# Patient Record
Sex: Female | Born: 1965 | Race: Black or African American | Hispanic: No | Marital: Single | Smoking: Current every day smoker
Health system: Southern US, Community
[De-identification: ages and names within clinical notes are randomized; demographics above are authoritative.]

## PROBLEM LIST (undated history)

## (undated) DIAGNOSIS — E785 Hyperlipidemia, unspecified: Secondary | ICD-10-CM

## (undated) DIAGNOSIS — I10 Essential (primary) hypertension: Secondary | ICD-10-CM

## (undated) HISTORY — DX: Essential (primary) hypertension: I10

## (undated) HISTORY — PX: BREAST BIOPSY: SHX20

## (undated) HISTORY — DX: Hyperlipidemia, unspecified: E78.5

---

## 2013-06-29 HISTORY — PX: APPENDECTOMY: SHX54

## 2014-04-28 ENCOUNTER — Ambulatory Visit: Payer: Self-pay | Admitting: Urology

## 2014-06-26 ENCOUNTER — Emergency Department: Payer: Self-pay | Admitting: Internal Medicine

## 2014-09-24 LAB — HEPATIC FUNCTION PANEL
ALK PHOS: 94 U/L (ref 25–125)
ALT: 42 U/L — AB (ref 7–35)
AST: 37 U/L — AB (ref 13–35)
BILIRUBIN, TOTAL: 0.2 mg/dL

## 2014-09-24 LAB — TSH: TSH: 0.74 u[IU]/mL (ref <=5.90)

## 2014-09-24 LAB — BASIC METABOLIC PANEL
BUN: 5 mg/dL (ref 4–21)
CREATININE: 0.6 mg/dL (ref <=1.1)
GLUCOSE: 93 mg/dL
SODIUM: 141 mmol/L (ref 137–147)

## 2014-09-24 LAB — LIPID PANEL
Cholesterol: 227 mg/dL — AB (ref 0–200)
HDL: 29 mg/dL — AB (ref 35–70)
Triglycerides: 617 mg/dL — AB (ref 40–160)

## 2014-09-24 LAB — CBC AND DIFFERENTIAL
HCT: 40 % (ref 36–46)
Hemoglobin: 13.3 g/dL (ref 12.0–16.0)
PLATELETS: 368 10*3/uL (ref 150–399)
WBC: 5.9 10*3/mL

## 2014-10-02 ENCOUNTER — Ambulatory Visit: Payer: Self-pay | Admitting: Specialist

## 2015-03-31 ENCOUNTER — Other Ambulatory Visit: Payer: Self-pay

## 2015-04-15 ENCOUNTER — Ambulatory Visit: Payer: Self-pay | Admitting: Internal Medicine

## 2015-04-18 DIAGNOSIS — M5010 Cervical disc disorder with radiculopathy, unspecified cervical region: Secondary | ICD-10-CM

## 2015-04-18 DIAGNOSIS — E785 Hyperlipidemia, unspecified: Secondary | ICD-10-CM

## 2015-04-18 DIAGNOSIS — I10 Essential (primary) hypertension: Secondary | ICD-10-CM

## 2015-04-29 ENCOUNTER — Ambulatory Visit: Payer: Self-pay | Admitting: Internal Medicine

## 2015-07-30 IMAGING — CR CERVICAL SPINE - COMPLETE 4+ VIEW
1 series · 5 of 5 positions shown · non-contrast
Comparison: None.

CLINICAL DATA: Left-sided neck pain.

EXAM:
CERVICAL SPINE  4+ VIEWS

[Series 1: dxr cervical spine complete · 0.14mm/px · 5 of 5 slices shown]
[im 1/5]
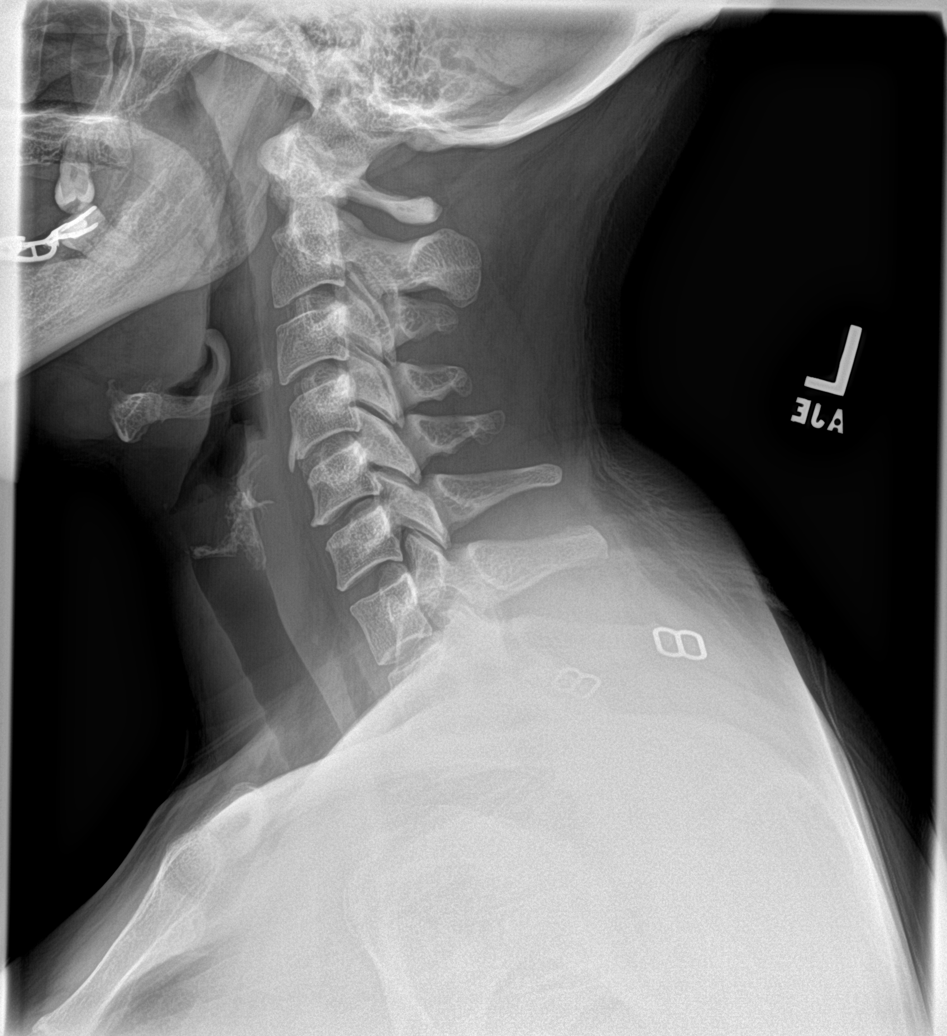
[im 2/5]
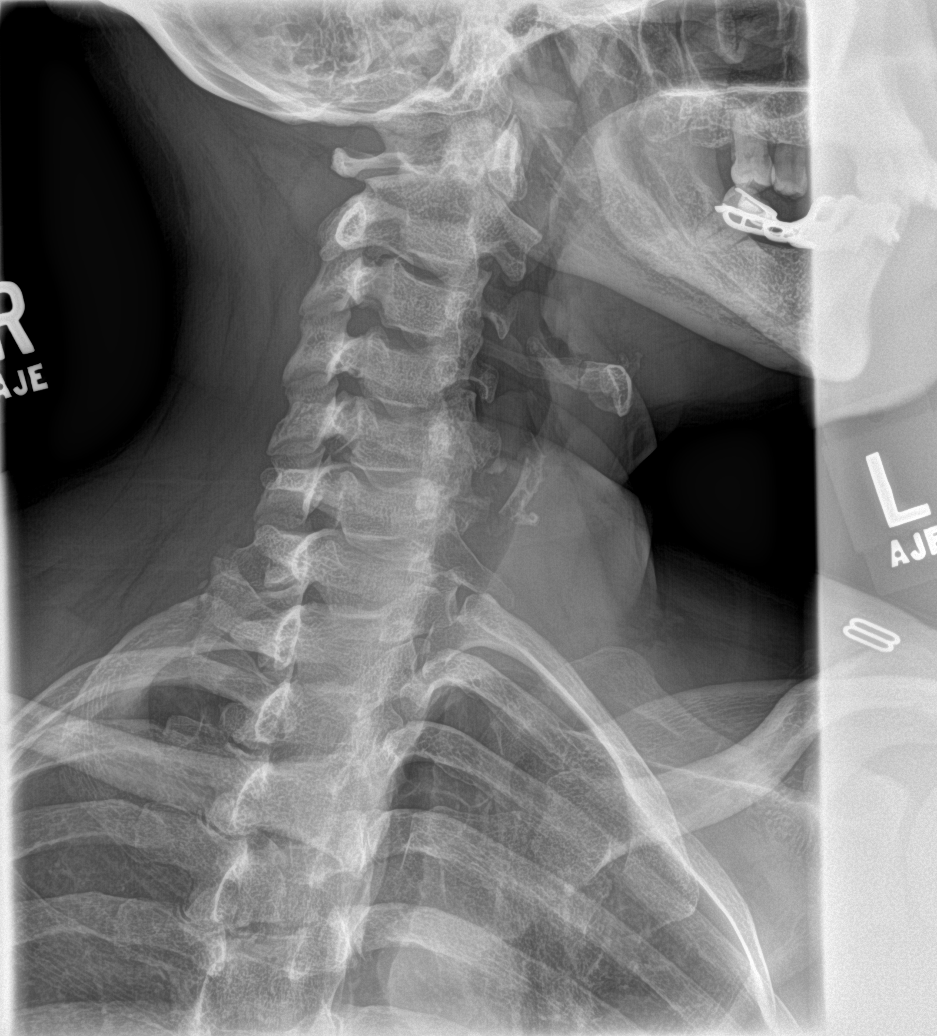
[im 3/5]
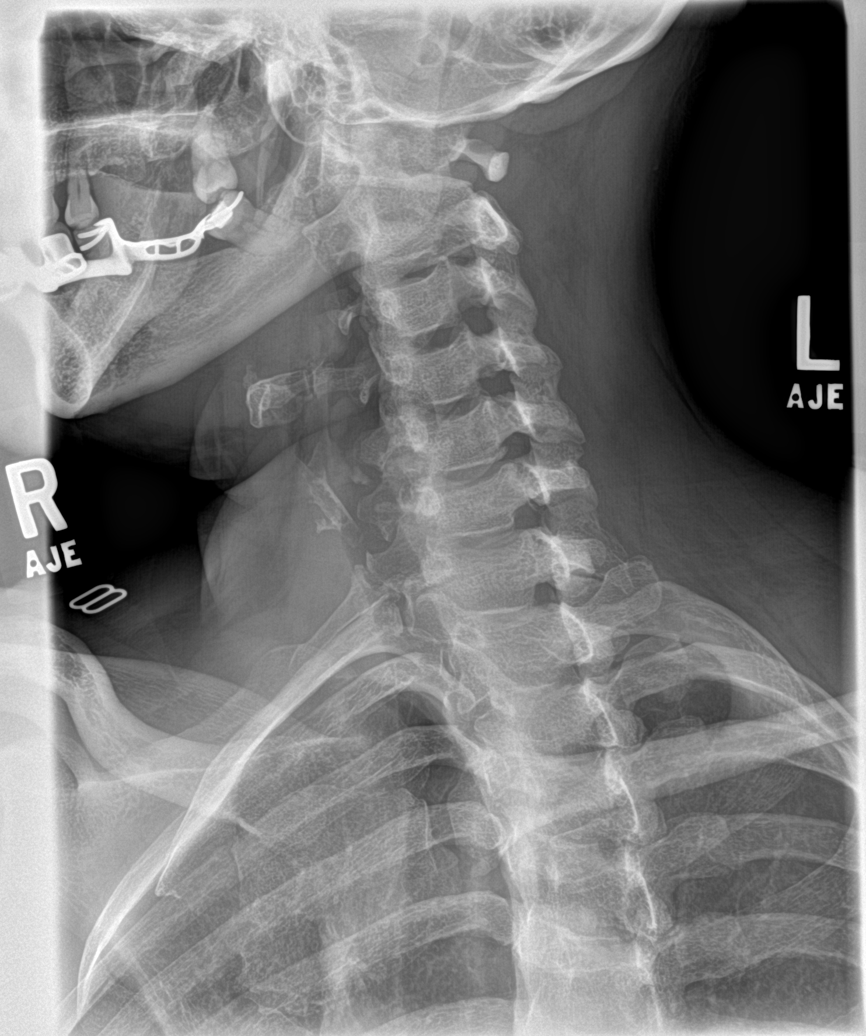
[im 4/5]
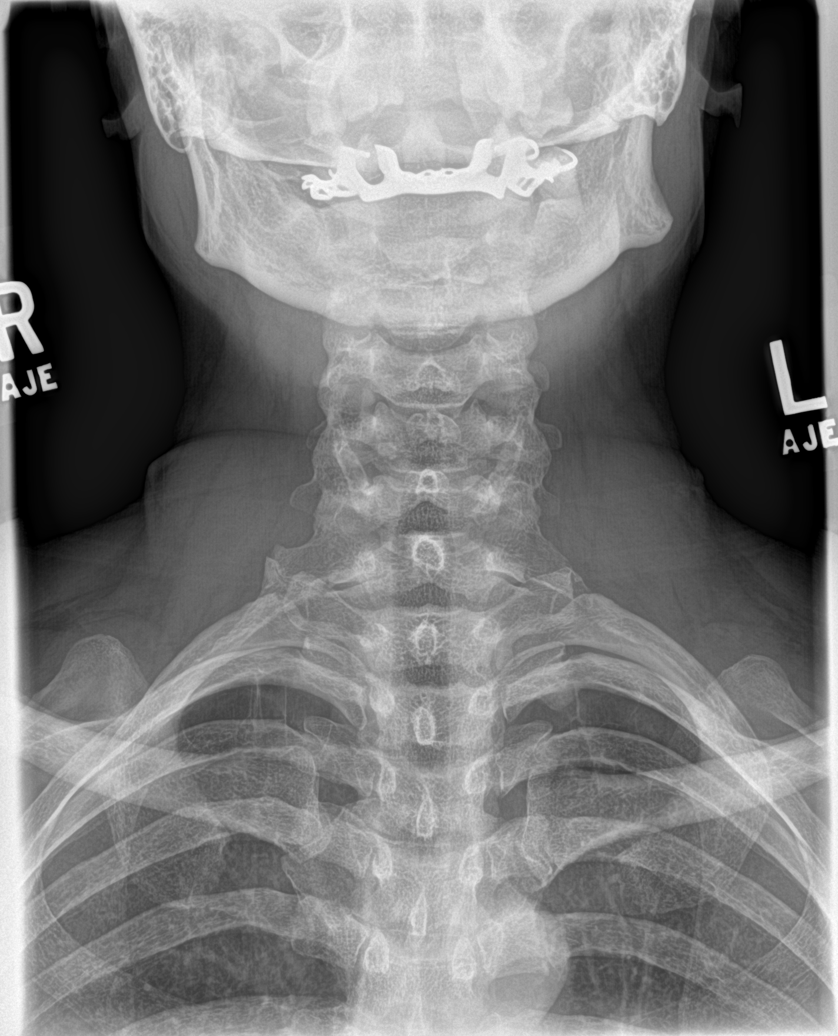
[im 5/5]
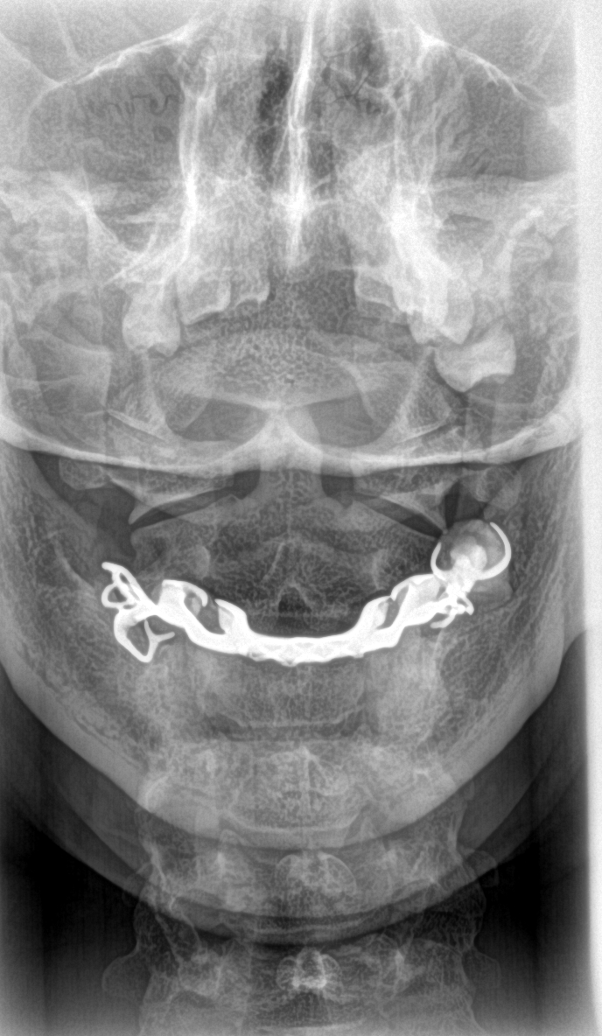

[5 of 5 positions shown; findings below may reference images not displayed]

FINDINGS: There is no evidence of cervical spine fracture or prevertebral soft
tissue swelling. Alignment is normal. Mild anterior vertebral
osteophyte formation seen at C4-5 and C5-6, with mild disc space
narrowing at C5-6. No other significant bone abnormality identified.
IMPRESSION: No acute findings.

Mild degenerative disc disease at C4-5 and C5-6.

## 2015-09-23 ENCOUNTER — Other Ambulatory Visit: Payer: Self-pay

## 2015-10-14 ENCOUNTER — Encounter: Payer: Self-pay | Admitting: Emergency Medicine

## 2015-10-14 ENCOUNTER — Emergency Department
Admission: EM | Admit: 2015-10-14 | Discharge: 2015-10-14 | Disposition: A | Discharge status: Home / Self Care | Payer: Self-pay | Attending: Emergency Medicine | Admitting: Emergency Medicine

## 2015-10-14 DIAGNOSIS — I10 Essential (primary) hypertension: Secondary | ICD-10-CM | POA: Insufficient documentation

## 2015-10-14 DIAGNOSIS — Z791 Long term (current) use of non-steroidal anti-inflammatories (NSAID): Secondary | ICD-10-CM | POA: Insufficient documentation

## 2015-10-14 DIAGNOSIS — Y998 Other external cause status: Secondary | ICD-10-CM | POA: Insufficient documentation

## 2015-10-14 DIAGNOSIS — F17210 Nicotine dependence, cigarettes, uncomplicated: Secondary | ICD-10-CM | POA: Insufficient documentation

## 2015-10-14 DIAGNOSIS — R21 Rash and other nonspecific skin eruption: Secondary | ICD-10-CM

## 2015-10-14 DIAGNOSIS — T63481A Toxic effect of venom of other arthropod, accidental (unintentional), initial encounter: Secondary | ICD-10-CM | POA: Insufficient documentation

## 2015-10-14 DIAGNOSIS — W57XXXA Bitten or stung by nonvenomous insect and other nonvenomous arthropods, initial encounter: Secondary | ICD-10-CM

## 2015-10-14 DIAGNOSIS — Y9389 Activity, other specified: Secondary | ICD-10-CM | POA: Insufficient documentation

## 2015-10-14 DIAGNOSIS — Z79899 Other long term (current) drug therapy: Secondary | ICD-10-CM | POA: Insufficient documentation

## 2015-10-14 DIAGNOSIS — Y9289 Other specified places as the place of occurrence of the external cause: Secondary | ICD-10-CM | POA: Insufficient documentation

## 2015-10-14 NOTE — ED Provider Notes (Signed)
[facility] Emergency Department Provider Note  ____________________________________________  Time seen: Approximately 5:43 PM  I have reviewed the triage vital signs and the nursing notes.   HISTORY  Chief Complaint Rash    HPI Erica Moreno is a 50 y.o. female with no relevant past medical history who presents with a gradual onset of rash over the last 3 days.  She states that the rash are distinct and individual itching and burning bumps primarily on her arms, her lower neck or upper chest, and a couple on her legs.  She states that the itching is much worse at night and in the morning it seems to be worse.  Benadryl helps a little.  It has been getting gradually worse over the last 3 days.  Some of the lesions are larger than others.  Several have a small central area but not all of them.  She denies fever/chills, chest pain, shortness breath, nausea, vomiting, abdominal pain, diarrhea, dysuria.  She has not started on any new medications recently.  She has not changed beauty products or soaps or detergents recently.  Her husband, with whom she shares her bed, has not had any lesions.She has not had any wheezing or difficulty breathing.  He has no significant seasonal or environmental allergies of which she is aware.   Past Medical History  Diagnosis Date  . Hypertension     Patient Active Problem List   Diagnosis Date Noted  . Essential hypertension 04/18/2015  . 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 Elevated lipids 04/18/2015    Past Surgical History  Procedure Laterality Date  . Appendectomy  06/2013  . Cesarean section  1988    Current Outpatient Rx  Name  Route  Sig  Dispense  Refill  . atenolol (TENORMIN) 50 MG tablet   Oral   Take 50 mg by mouth 2 (two) times daily.         . cyclobenzaprine (FLEXERIL) 10 MG tablet   Oral   Take 10 mg by mouth at bedtime.         Marland Kitchen gemfibrozil (LOPID) 600 MG  tablet   Oral   Take 600 mg by mouth 2 (two) times daily before a meal.         . hydrochlorothiazide (HYDRODIURIL) 25 MG tablet   Oral   Take 25 mg by mouth daily.         . naproxen (NAPROSYN) 250 MG tablet   Oral   Take 250 mg by mouth 2 (two) times daily with a meal.         . rosuvastatin (CRESTOR) 10 MG tablet   Oral   Take 10 mg by mouth daily.           Allergies Aspirin  Family History  Problem Relation Age of Onset  . Heart failure Mother   . Aneurysm Father   . Renal Disease Father     Social History Social History  Substance Use Topics  . Smoking status: Current Every Day Smoker -- 0.25 packs/day    Types: Cigarettes    Last Attempt to Quit: 01/16/2015  . Smokeless tobacco: None  . Alcohol Use: No    Review of Systems Constitutional: No fever/chills Eyes: No visual changes. ENT: No sore throat. Cardiovascular: Denies chest pain. Respiratory: Denies shortness of breath. Gastrointestinal: No abdominal pain.  No nausea, no vomiting.  No diarrhea.  No constipation. Genitourinary: Negative for dysuria. Musculoskeletal: Negative for back pain.  Skin: Itching and burning red raised rash on her arms, upper chest and lower neck, and a few on her legs Neurological: Negative for headaches, focal weakness or numbness.  10-point ROS otherwise negative.  ____________________________________________   PHYSICAL EXAM:  VITAL SIGNS: ED Triage Vitals  Enc Vitals Group     BP 10/14/15 1647 151/74 mmHg     Pulse Rate 10/14/15 1647 96     Resp 10/14/15 1647 20     Temp 10/14/15 1647 98.3 F (36.8 C)     Temp Source 10/14/15 1647 Oral     SpO2 10/14/15 1647 100 %     Weight 10/14/15 1647 158 lb (71.668 kg)     Height 10/14/15 1647  (1.651 m)     Head Cir --      Peak Flow --      Pain Score 10/14/15 1647 8     Pain Loc --      Pain Edu? --      Excl. in GC? --     Constitutional: Alert and oriented. Well appearing and in no acute  distress. Eyes: Conjunctivae are normal. PERRL. EOMI. Head: Atraumatic. Nose: No congestion/rhinnorhea. Mouth/Throat: Mucous membranes are moist.  Oropharynx non-erythematous.  No intraoral lesions, no mucosal involvement. Neck: No stridor.   Hematological/Lymphatic/Immunilogical: No cervical lymphadenopathy. Cardiovascular: Normal rate, regular rhythm. Grossly normal heart sounds.  Good peripheral circulation. Respiratory: Normal respiratory effort.  No retractions. Lungs CTAB. Gastrointestinal: Soft and nontender. No distention. No abdominal bruits. No CVA tenderness. Musculoskeletal: No lower extremity tenderness nor edema.  No joint effusions. Neurologic:  Normal speech and language. No gross focal neurologic deficits are appreciated.  Skin:  Skin is warm, dry and intact.  She has multiple maculopapular lesions of varying sizes from several millimeters to several centimeters in diameter.  These are on exposed surfaces such as her arms and the lower part of her neck.  There is a linear pattern to many of them although not close enough together to be clearly consistent with bedbugs.  A few of the lesions have a very small central pustule but these are very tiny and and not consistent with "dewdrops on a rose petal".  There is no evidence of cellulitis or bacterial superinfection.  There are no lesions on her anterior torso or abdomen.  There are no lesions on her palms or soles. Psychiatric: Mood and affect are normal. Speech and behavior are normal.  ____________________________________________   LABS (all labs ordered are listed, but only abnormal results are displayed)  Labs Reviewed - No data to display ____________________________________________  EKG  None ____________________________________________  RADIOLOGY   No results found.  ____________________________________________   PROCEDURES  Procedure(s) performed: None  Critical Care performed:  No ____________________________________________   INITIAL IMPRESSION / ASSESSMENT AND PLAN / ED COURSE  Pertinent labs & imaging results that were available during my care of the patient were reviewed by me and considered in my medical decision making (see chart for details).  The patient's lesions appear most consistent with insect bites although the specific etiology is unknown.  It is no evidence of Stevens-Johnson syndrome, bullous pemphigoid, erythema multiforme, or other emergent medical condition at this time.  I counseled the patient that she can use some cream or ointment on the lesions and take oral Benadryl, but that she also needs to follow up with dermatology and also have a pest control services investigate her house to determine if she has bedbugs or another infestation that is causing  the lesions.  She understands and agrees with the plan.  ____________________________________________  FINAL CLINICAL IMPRESSION(S) / ED DIAGNOSES  Final diagnoses:  Insect bites and stings, accidental or unintentional, initial encounter  Maculopapular rash      NEW MEDICATIONS STARTED DURING THIS VISIT:  New Prescriptions   No medications on file     Hinda Kehr, MD 10/14/15 1839

## 2015-10-14 NOTE — ED Notes (Signed)
Patient presents to the ED with a rash that she first noticed about 3 days ago.  Patient states rash is on legs, back, arms, and neck.  Patient denies changing soaps or detergents.  Patient is in no obvious distress at this time.

## 2015-10-14 NOTE — Discharge Instructions (Signed)
As we discussed, we believe you are rash is due to insect bites.  Please continue to take over-the-counter Benadryl as needed and as recommended on the package instructions.  Additionally we encourage you to use over-the-counter hydrocortisone 1% ointment or cream on the lesions that are most bothersome to which should help with the inflammation and itching.  We encourage you to follow up with the [facility] for additional evaluation.  We also encourage you to contact a pest control center to come out to your house and do an investigation to determine if you do have an infestation that could be treated.  Return to the emergency department if he develop any new or worsening symptoms that concern you.

## 2015-10-27 ENCOUNTER — Other Ambulatory Visit: Payer: Self-pay

## 2015-10-27 DIAGNOSIS — E785 Hyperlipidemia, unspecified: Secondary | ICD-10-CM

## 2015-10-27 DIAGNOSIS — I10 Essential (primary) hypertension: Secondary | ICD-10-CM

## 2015-10-27 DIAGNOSIS — Z139 Encounter for screening, unspecified: Secondary | ICD-10-CM

## 2015-10-28 ENCOUNTER — Other Ambulatory Visit: Payer: Self-pay

## 2015-11-04 ENCOUNTER — Ambulatory Visit: Payer: Self-pay | Admitting: Internal Medicine

## 2015-11-10 ENCOUNTER — Telehealth: Payer: Self-pay

## 2015-11-10 NOTE — Telephone Encounter (Signed)
Called patient  to schedule appointment, voice mail was full. [phone]

## 2015-11-10 NOTE — Telephone Encounter (Signed)
Called patient to reschedule appointment, mail box was full. [phone]

## 2015-11-10 NOTE — Telephone Encounter (Signed)
Patient called to reschedule missed appointment. [phone]

## 2015-11-19 ENCOUNTER — Telehealth: Payer: Self-pay

## 2015-11-19 NOTE — Telephone Encounter (Signed)
Patient called to reschedule missed appointment. [phone] 

## 2015-12-03 ENCOUNTER — Ambulatory Visit: Payer: Self-pay

## 2015-12-08 ENCOUNTER — Telehealth: Payer: Self-pay

## 2015-12-08 NOTE — Telephone Encounter (Signed)
Lm to discuss appointment, pt must pay $16 no show fee and needs to complete [facility], Mrs has no showed last 4 appointments  [phone]    No answer @ [phone]

## 2015-12-08 NOTE — Telephone Encounter (Signed)
Pt lm to sched an appointment.,  [phone]

## 2015-12-24 ENCOUNTER — Ambulatory Visit: Payer: Self-pay | Admitting: Family Medicine

## 2015-12-24 VITALS — BP 176/90 | HR 77 | Ht 65.0 in | Wt 158.0 lb

## 2015-12-24 DIAGNOSIS — Z5181 Encounter for therapeutic drug level monitoring: Secondary | ICD-10-CM

## 2015-12-24 DIAGNOSIS — M5010 Cervical disc disorder with radiculopathy, unspecified cervical region: Secondary | ICD-10-CM

## 2015-12-24 DIAGNOSIS — Z1211 Encounter for screening for malignant neoplasm of colon: Secondary | ICD-10-CM | POA: Insufficient documentation

## 2015-12-24 DIAGNOSIS — E785 Hyperlipidemia, unspecified: Secondary | ICD-10-CM

## 2015-12-24 DIAGNOSIS — I10 Essential (primary) hypertension: Secondary | ICD-10-CM

## 2015-12-24 DIAGNOSIS — M25512 Pain in left shoulder: Secondary | ICD-10-CM

## 2015-12-24 MED ORDER — NAPROXEN 250 MG PO TABS: 250.0000 mg | ORAL_TABLET | Freq: Two times a day (BID) | ORAL | Status: DC

## 2015-12-24 MED ORDER — ROSUVASTATIN CALCIUM 10 MG PO TABS: 10.0000 mg | ORAL_TABLET | Freq: Every day | ORAL | Status: DC

## 2015-12-24 MED ORDER — ATENOLOL 50 MG PO TABS: 50.0000 mg | ORAL_TABLET | Freq: Two times a day (BID) | ORAL | Status: DC

## 2015-12-24 MED ORDER — CYCLOBENZAPRINE HCL 10 MG PO TABS: 10.0000 mg | ORAL_TABLET | Freq: Every day | ORAL | Status: DC

## 2015-12-24 MED ORDER — HYDROCHLOROTHIAZIDE 25 MG PO TABS: 25.0000 mg | ORAL_TABLET | Freq: Every day | ORAL | Status: DC

## 2015-12-24 NOTE — Patient Instructions (Addendum)
We'll check your labs today and contact you with those results; if you have not heard anything in one week, please call the clinic We'll refer you for a screening colonoscopy; likewise, if you have not heard anything from [place] or [facility] about the colonoscopy in 2 weeks, please call us Try to limit saturated fats in your diet (bologna, hot dogs, barbeque, cheeseburgers, hamburgers, steak, bacon, sausage, cheese, etc.) and get more fresh fruits, vegetables, and whole grains Your goal blood pressure is less than 140 mmHg on top. Try to follow the DASH guidelines (DASH stands for Dietary Approaches to Stop Hypertension) Try to limit the sodium in your diet.  Ideally, consume less than 1.5 grams (less than 1,500mg ) per day. Do not add salt when cooking or at the table.  Check the sodium amount on labels when shopping, and choose items lower in sodium when given a choice. Avoid or limit foods that already contain a lot of sodium. Eat a diet rich in fruits and vegetables and whole grains. Try to use tylenol (acetaminophen) per package directions for most aches and pains when possible; use the naproxen only when needed as that can raise your blood pressure I do encourage you to quit smoking Call [phone] to sign up for smoking cessation classes You can call 1-800-QUIT-NOW to talk with a smoking cessation coach   DASH Eating Plan DASH stands for "Dietary Approaches to Stop Hypertension." The DASH eating plan is a healthy eating plan that has been shown to reduce high blood pressure (hypertension). Additional health benefits may include reducing the risk of type 2 diabetes mellitus, heart disease, and stroke. The DASH eating plan may also help with weight loss. WHAT DO I NEED TO KNOW ABOUT THE DASH EATING PLAN? For the DASH eating plan, you will follow these general guidelines:  Choose foods with a percent daily value for sodium of less than 5% (as listed on the food label).  Use salt-free seasonings or  herbs instead of table salt or sea salt.  Check with your health care provider or pharmacist before using salt substitutes.  Eat lower-sodium products, often labeled as "lower sodium" or "no salt added."  Eat fresh foods.  Eat more vegetables, fruits, and low-fat dairy products.  Choose whole grains. Look for the word "whole" as the first word in the ingredient list.  Choose fish and skinless chicken or [place] more often than red meat. Limit fish, poultry, and meat to 6 oz (170 g) each day.  Limit sweets, desserts, sugars, and sugary drinks.  Choose heart-healthy fats.  Limit cheese to 1 oz (28 g) per day.  Eat more home-cooked food and less restaurant, buffet, and fast food.  Limit fried foods.  Cook foods using methods other than frying.  Limit canned vegetables. If you do use them, rinse them well to decrease the sodium.  When eating at a restaurant, ask that your food be prepared with less salt, or no salt if possible. WHAT FOODS CAN I EAT? Seek help from a dietitian for individual calorie needs. Grains Whole grain or whole wheat bread. Brown rice. Whole grain or whole wheat pasta. Quinoa, bulgur, and whole grain cereals. Low-sodium cereals. Corn or whole wheat flour tortillas. Whole grain cornbread. Whole grain crackers. Low-sodium crackers. Vegetables Fresh or frozen vegetables (raw, steamed, roasted, or grilled). Low-sodium or reduced-sodium tomato and vegetable juices. Low-sodium or reduced-sodium tomato sauce and paste. Low-sodium or reduced-sodium canned vegetables.  Fruits All fresh, canned (in natural juice), or frozen fruits. Meat  and Other Protein Products Ground beef (85% or leaner), grass-fed beef, or beef trimmed of fat. Skinless chicken or [place]. Ground chicken or [place]. Pork trimmed of fat. All fish and seafood. Eggs. Dried beans, peas, or lentils. Unsalted nuts and seeds. Unsalted canned beans. Dairy Low-fat dairy products, such as skim or 1% milk, 2% or  reduced-fat cheeses, low-fat ricotta or cottage cheese, or plain low-fat yogurt. Low-sodium or reduced-sodium cheeses. Fats and Oils Tub margarines without trans fats. Light or reduced-fat mayonnaise and salad dressings (reduced sodium). Avocado. Safflower, olive, or canola oils. Natural peanut or almond butter. Other Unsalted popcorn and pretzels. The items listed above may not be a complete list of recommended foods or beverages. Contact your dietitian for more options. WHAT FOODS ARE NOT RECOMMENDED? Grains White bread. White pasta. White rice. Refined cornbread. Bagels and croissants. Crackers that contain trans fat. Vegetables Creamed or fried vegetables. Vegetables in a cheese sauce. Regular canned vegetables. Regular canned tomato sauce and paste. Regular tomato and vegetable juices. Fruits Dried fruits. Canned fruit in light or heavy syrup. Fruit juice. Meat and Other Protein Products Fatty cuts of meat. Ribs, chicken wings, bacon, sausage, bologna, salami, chitterlings, fatback, hot dogs, bratwurst, and packaged luncheon meats. Salted nuts and seeds. Canned beans with salt. Dairy Whole or 2% milk, cream, half-and-half, and cream cheese. Whole-fat or sweetened yogurt. Full-fat cheeses or blue cheese. Nondairy creamers and whipped toppings. Processed cheese, cheese spreads, or cheese curds. Condiments Onion and garlic salt, seasoned salt, table salt, and sea salt. Canned and packaged gravies. Worcestershire sauce. Tartar sauce. Barbecue sauce. Teriyaki sauce. Soy sauce, including reduced sodium. Steak sauce. Fish sauce. Oyster sauce. Cocktail sauce. Horseradish. Ketchup and mustard. Meat flavorings and tenderizers. Bouillon cubes. Hot sauce. Tabasco sauce. Marinades. Taco seasonings. Relishes. Fats and Oils Butter, stick margarine, lard, shortening, ghee, and bacon fat. Coconut, palm kernel, or palm oils. Regular salad dressings. Other Pickles and olives. Salted popcorn and  pretzels. The items listed above may not be a complete list of foods and beverages to avoid. Contact your dietitian for more information. WHERE CAN I FIND MORE INFORMATION? National Heart, Lung, and Blood Institute: [employer]   This information is not intended to replace advice given to you by your health care provider. Make sure you discuss any questions you have with your health care provider.   Document Released: 08/04/2011 Document Revised: 09/05/2014 Document Reviewed: 06/19/2013 Elsevier Interactive Patient Education 2016 [employer] Cholesterol Cholesterol is a white, waxy, fat-like substance needed by your body in small amounts. The liver makes all the cholesterol you need. Cholesterol is carried from the liver by the blood through the blood vessels. Deposits of cholesterol (plaque) may build up on blood vessel walls. These make the arteries narrower and stiffer. Cholesterol plaques increase the risk for heart attack and stroke.  You cannot feel your cholesterol level even if it is very high. The only way to know it is high is with a blood test. Once you know your cholesterol levels, you should keep a record of the test results. Work with your health care provider to keep your levels in the desired range.  WHAT DO THE RESULTS MEAN?  Total cholesterol is a rough measure of all the cholesterol in your blood.   LDL is the so-called bad cholesterol. This is the type that deposits cholesterol in the walls of the arteries. You want this level to be low.   HDL is the good cholesterol because it cleans the arteries and carries the  LDL away. You want this level to be high.  Triglycerides are fat that the body can either burn for energy or store. High levels are closely linked to heart disease.  WHAT ARE THE DESIRED LEVELS OF CHOLESTEROL?  Total cholesterol below 200.   LDL below 100 for people at risk, below 70 for those at very high risk.    HDL above 50 is good, above 60 is best.   Triglycerides below 150.  HOW CAN I LOWER MY CHOLESTEROL?  Diet. Follow your diet programs as directed by your health care provider.   Choose fish or white meat chicken and [place], roasted or baked. Limit fatty cuts of red meat, fried foods, and processed meats, such as sausage and lunch meats.   Eat lots of fresh fruits and vegetables.  Choose whole grains, beans, pasta, potatoes, and cereals.   Use only small amounts of olive, corn, or canola oils.   Avoid butter, mayonnaise, shortening, or palm kernel oils.  Avoid foods with trans fats.   Drink skim or nonfat milk and eat low-fat or nonfat yogurt and cheeses. Avoid whole milk, cream, ice cream, egg yolks, and full-fat cheeses.   Healthy desserts include angel food cake, ginger snaps, animal crackers, hard candy, popsicles, and low-fat or nonfat frozen yogurt. Avoid pastries, cakes, pies, and cookies.   Exercise. Follow your exercise programs as directed by your health care provider.   A regular program helps decrease LDL and raise HDL.   A regular program helps with weight control.   Do things that increase your activity level like gardening, walking, or taking the stairs. Ask your health care provider about how you can be more active in your daily life.   Medicine. Take medicine only as directed by your health care provider.   Medicine may be prescribed by your health care provider to help lower cholesterol and decrease the risk for heart disease.   If you have several risk factors, you may need medicine even if your levels are normal.   This information is not intended to replace advice given to you by your health care provider. Make sure you discuss any questions you have with your health care provider.   Document Released: 05/10/2001 Document Revised: 09/05/2014 Document Reviewed: 05/29/2013 Elsevier Interactive Patient Education 2016 [employer]. You Can  Quit Smoking If you are ready to quit smoking or are thinking about it, congratulations! You have chosen to help yourself be healthier and live longer! There are lots of different ways to quit smoking. Nicotine gum, nicotine patches, a nicotine inhaler, or nicotine nasal spray can help with physical craving. Hypnosis, support groups, and medicines help break the habit of smoking. TIPS TO GET OFF AND STAY OFF CIGARETTES  Learn to predict your moods. Do not let a bad situation be your excuse to have a cigarette. Some situations in your life might tempt you to have a cigarette.  Ask friends and co-workers not to smoke around you.  Make your home smoke-free.  Never have "just one" cigarette. It leads to wanting another and another. Remind yourself of your decision to quit.  On a card, make a list of your reasons for not smoking. Read it at least the same number of times a day as you have a cigarette. Tell yourself everyday, "I do not want to smoke. I choose not to smoke."  Ask someone at home or work to help you with your plan to quit smoking.  Have something planned after you eat  or have a cup of coffee. Take a walk or get other exercise to perk you up. This will help to keep you from overeating.  Try a relaxation exercise to calm you down and decrease your stress. Remember, you may be tense and nervous the first two weeks after you quit. This will pass.  Find new activities to keep your hands busy. Play with a pen, coin, or rubber band. Doodle or draw things on paper.  Brush your teeth right after eating. This will help cut down the craving for the taste of tobacco after meals. You can try mouthwash too.  Try gum, breath mints, or diet candy to keep something in your mouth. IF YOU SMOKE AND WANT TO QUIT:  Do not stock up on cigarettes. Never buy a carton. Wait until one pack is finished before you buy another.  Never carry cigarettes with you at work or at home.  Keep cigarettes as far  away from you as possible. Leave them with someone else.  Never carry matches or a lighter with you.  Ask yourself, "Do I need this cigarette or is this just a reflex?"  Bet with someone that you can quit. Put cigarette money in a piggy bank every morning. If you smoke, you give up the money. If you do not smoke, by the end of the week, you keep the money.  Keep trying. It takes 21 days to change a habit!  Talk to your doctor about using medicines to help you quit. These include nicotine replacement gum, lozenges, or skin patches.   This information is not intended to replace advice given to you by your health care provider. Make sure you discuss any questions you have with your health care provider.   Document Released: 06/11/2009 Document Revised: 11/07/2011 Document Reviewed: 06/11/2009 Elsevier Interactive Patient Education 2016 [employer]. Smoking Hazards Smoking cigarettes is extremely bad for your health. Tobacco smoke has over 200 known poisons in it. It contains the poisonous gases nitrogen oxide and carbon monoxide. There are over 60 chemicals in tobacco smoke that cause cancer. Some of the chemicals found in cigarette smoke include:   Cyanide.   Benzene.   Formaldehyde.   Methanol (wood alcohol).   Acetylene (fuel used in welding torches).   Ammonia.  Even smoking lightly shortens your life expectancy by several years. You can greatly reduce the risk of medical problems for you and your family by stopping now. Smoking is the most preventable cause of death and disease in our society. Within days of quitting smoking, your circulation improves, you decrease the risk of having a heart attack, and your lung capacity improves. There may be some increased phlegm in the first few days after quitting, and it may take months for your lungs to clear up completely. Quitting for 10 years reduces your risk of developing lung cancer to almost that of a nonsmoker.  WHAT ARE THE  RISKS OF SMOKING? Cigarette smokers have an increased risk of many serious medical problems, including:  Lung cancer.   Lung disease (such as pneumonia, bronchitis, and emphysema).   Heart attack and chest pain due to the heart not getting enough oxygen (angina).   Heart disease and peripheral blood vessel disease.   Hypertension.   Stroke.   Oral cancer (cancer of the lip, mouth, or voice box).   Bladder cancer.   Pancreatic cancer.   Cervical cancer.   Pregnancy complications, including premature birth.   Stillbirths and smaller newborn babies, birth defects,  and genetic damage to sperm.   Early menopause.   Lower estrogen level for women.   Infertility.   Facial wrinkles.   Blindness.   Increased risk of broken bones (fractures).   Senile dementia.   Stomach ulcers and internal bleeding.   Delayed wound healing and increased risk of complications during surgery. Because of secondhand smoke exposure, children of smokers have an increased risk of the following:   Sudden infant death syndrome (SIDS).   Respiratory infections.   Lung cancer.   Heart disease.   Ear infections.  WHY IS SMOKING ADDICTIVE? Nicotine is the chemical agent in tobacco that is capable of causing addiction or dependence. When you smoke and inhale, nicotine is absorbed rapidly into the bloodstream through your lungs. Both inhaled and noninhaled nicotine may be addictive.  WHAT ARE THE BENEFITS OF QUITTING?  There are many health benefits to quitting smoking. Some are:   The likelihood of developing cancer and heart disease decreases. Health improvements are seen almost immediately.   Blood pressure, pulse rate, and breathing patterns start returning to normal soon after quitting.   People who quit may see an improvement in their overall quality of life.  HOW DO YOU QUIT SMOKING? Smoking is an addiction with both physical and psychological effects, and  longtime habits can be hard to change. Your health care provider can recommend:  Programs and community resources, which may include group support, education, or therapy.  Replacement products, such as patches, gum, and nasal sprays. Use these products only as directed. Do not replace cigarette smoking with electronic cigarettes (commonly called e-cigarettes). The safety of e-cigarettes is unknown, and some may contain harmful chemicals. FOR MORE INFORMATION  American Lung Association: www.lung.org  American Cancer Society: www.cancer.org   This information is not intended to replace advice given to you by your health care provider. Make sure you discuss any questions you have with your health care provider.   Document Released: 09/22/2004 Document Revised: 06/05/2013 Document Reviewed: 02/04/2013 Elsevier Interactive Patient Education 2016 [employer] Smoking Cessation, Tips for Success If you are ready to quit smoking, congratulations! You have chosen to help yourself be healthier. Cigarettes bring nicotine, tar, carbon monoxide, and other irritants into your body. Your lungs, heart, and blood vessels will be able to work better without these poisons. There are many different ways to quit smoking. Nicotine gum, nicotine patches, a nicotine inhaler, or nicotine nasal spray can help with physical craving. Hypnosis, support groups, and medicines help break the habit of smoking. WHAT THINGS CAN I DO TO MAKE QUITTING EASIER?  Here are some tips to help you quit for good:  Pick a date when you will quit smoking completely. Tell all of your friends and family about your plan to quit on that date.  Do not try to slowly cut down on the number of cigarettes you are smoking. Pick a quit date and quit smoking completely starting on that day.  Throw away all cigarettes.   Clean and remove all ashtrays from your home, work, and car.  On a card, write down your reasons for quitting. Carry the card  with you and read it when you get the urge to smoke.  Cleanse your body of nicotine. Drink enough water and fluids to keep your urine clear or pale yellow. Do this after quitting to flush the nicotine from your body.  Learn to predict your moods. Do not let a bad situation be your excuse to have a cigarette. Some situations  in your life might tempt you into wanting a cigarette.  Never have "just one" cigarette. It leads to wanting another and another. Remind yourself of your decision to quit.  Change habits associated with smoking. If you smoked while driving or when feeling stressed, try other activities to replace smoking. Stand up when drinking your coffee. Brush your teeth after eating. Sit in a different chair when you read the paper. Avoid alcohol while trying to quit, and try to drink fewer caffeinated beverages. Alcohol and caffeine may urge you to smoke.  Avoid foods and drinks that can trigger a desire to smoke, such as sugary or spicy foods and alcohol.  Ask people who smoke not to smoke around you.  Have something planned to do right after eating or having a cup of coffee. For example, plan to take a walk or exercise.  Try a relaxation exercise to calm you down and decrease your stress. Remember, you may be tense and nervous for the first 2 weeks after you quit, but this will pass.  Find new activities to keep your hands busy. Play with a pen, coin, or rubber band. Doodle or draw things on paper.  Brush your teeth right after eating. This will help cut down on the craving for the taste of tobacco after meals. You can also try mouthwash.   Use oral substitutes in place of cigarettes. Try using lemon drops, carrots, cinnamon sticks, or chewing gum. Keep them handy so they are available when you have the urge to smoke.  When you have the urge to smoke, try deep breathing.  Designate your home as a nonsmoking area.  If you are a heavy smoker, ask your health care provider about a  prescription for nicotine chewing gum. It can ease your withdrawal from nicotine.  Reward yourself. Set aside the cigarette money you save and buy yourself something nice.  Look for support from others. Join a support group or smoking cessation program. Ask someone at home or at work to help you with your plan to quit smoking.  Always ask yourself, "Do I need this cigarette or is this just a reflex?" Tell yourself, "Today, I choose not to smoke," or "I do not want to smoke." You are reminding yourself of your decision to quit.  Do not replace cigarette smoking with electronic cigarettes (commonly called e-cigarettes). The safety of e-cigarettes is unknown, and some may contain harmful chemicals.  If you relapse, do not give up! Plan ahead and think about what you will do the next time you get the urge to smoke. HOW WILL I FEEL WHEN I QUIT SMOKING? You may have symptoms of withdrawal because your body is used to nicotine (the addictive substance in cigarettes). You may crave cigarettes, be irritable, feel very hungry, cough often, get headaches, or have difficulty concentrating. The withdrawal symptoms are only temporary. They are strongest when you first quit but will go away within 10-14 days. When withdrawal symptoms occur, stay in control. Think about your reasons for quitting. Remind yourself that these are signs that your body is healing and getting used to being without cigarettes. Remember that withdrawal symptoms are easier to treat than the major diseases that smoking can cause.  Even after the withdrawal is over, expect periodic urges to smoke. However, these cravings are generally short lived and will go away whether you smoke or not. Do not smoke! WHAT RESOURCES ARE AVAILABLE TO HELP ME QUIT SMOKING? Your health care provider can direct you to  community resources or hospitals for support, which may include:  Group support.  Education.  Hypnosis.  Therapy.   This information is  not intended to replace advice given to you by your health care provider. Make sure you discuss any questions you have with your health care provider.   Document Released: 05/13/2004 Document Revised: 09/05/2014 Document Reviewed: 01/31/2013 Elsevier Interactive Patient Education [employer]

## 2015-12-24 NOTE — Assessment & Plan Note (Signed)
Refer to GI 

## 2015-12-24 NOTE — Assessment & Plan Note (Addendum)
Try DASH guidelines; BP high today b/c patient has been out of medicines for 6 days; refills provided; limit NSAIDs; smoking cessation in AVS; return in 2 weeks for recheck

## 2015-12-24 NOTE — Assessment & Plan Note (Addendum)
Check lipids; try limiting saturated fats; will stop gemfibrozil and likely increase Crestor but will check labs today; if unfavorable, increase crestor from 10 mg to 20 mg

## 2015-12-24 NOTE — Assessment & Plan Note (Signed)
Continue naproxen if needed and flexeril at night; no radicullpathy

## 2015-12-24 NOTE — Progress Notes (Signed)
BP 176/90 mmHg  Pulse 77  Ht  (1.651 m)  Wt 158 lb (71.668 kg)  BMI 26.29 kg/m2  SpO2 98%   Subjective:    Patient ID: Erica Moreno, female    DOB: 1966-05-24, 50 y.o.   MRN: [id number]  HPI: Erica Moreno is a 50 y.o. female   Chief Complaint  Patient presents with  . Hypertension    pt has not taken htn meds x 6days, pt rpts "seeing spots" x 1 day  . Medication Refill    Sudden pain today, under the left shoulder; has used flexeril, especially when working too much; spasms in back and shoulder; some shortness of breath  She has been out of her blood pressure medicine for about 6 days; mother and grandmother and aunt has HTN, heart disease;   High cholesterol; controlled with current regimen, but on two drugs; no myopathy; urine normal; occasionally eats eggs; chicken eater; last thing was peanut butter crackers about 2:30 pm  Relevant past medical, surgical, family and social history reviewed and updated as indicated. Past Medical History  Diagnosis Date  . Hypertension    Past Surgical History  Procedure Laterality Date  . Appendectomy  06/2013  . Cesarean section  1988   Family History  Problem Relation Age of Onset  . Heart failure Mother   . Aneurysm Father   . Renal Disease Father    Social History  Substance Use Topics  . Smoking status: Current Every Day Smoker -- 0.25 packs/day    Types: Cigarettes    Last Attempt to Quit: 01/16/2015  . Smokeless tobacco: Not on file  . Alcohol Use: No   Interim medical history since last visit reviewed. Allergies and medications reviewed and updated.  Review of Systems Per HPI unless specifically indicated above     Objective:    BP 176/90 mmHg  Pulse 77  Ht  (1.651 m)  Wt 158 lb (71.668 kg)  BMI 26.29 kg/m2  SpO2 98%  Wt Readings from Last 3 Encounters:  12/24/15 158 lb (71.668 kg)  10/14/15 158 lb (71.668 kg)  10/15/14 160 lb (72.576 kg)    Physical Exam  Constitutional: She appears  well-developed and well-nourished. No distress.  HENT:  Head: Normocephalic and atraumatic.  Eyes: EOM are normal. No scleral icterus.  Neck: No JVD present.  Cardiovascular: Normal rate and regular rhythm.   Pulmonary/Chest: Effort normal and breath sounds normal.  Abdominal: She exhibits no distension. There is no tenderness.  Musculoskeletal: She exhibits no edema.       Right shoulder: She exhibits tenderness (tender over the upper posterior right shoulder, lateral rhomboid, trap, tender to touch, reproduces pain).  Neurological: She is alert.  Skin: Skin is warm. No pallor.  Psychiatric: She has a normal mood and affect.    Results for orders placed or performed in visit on 04/18/15  CBC and differential  Result Value Ref Range   Hemoglobin 13.3 12.0 - 16.0 g/dL   HCT 40 36 - 46 %   Platelets 368 150 - 399 K/L   WBC 5.9 10^3/mL  Basic metabolic panel  Result Value Ref Range   Glucose 93 mg/dL   BUN 5 4 - 21 mg/dL   Creatinine 0.6 .5 - 1.1 mg/dL   Sodium 782 956 - 213 mmol/L  Lipid panel  Result Value Ref Range   Triglycerides 617 (A) 40 - 160 mg/dL   Cholesterol 086 (A) 0 - 200 mg/dL   HDL  29 (A) 35 - 70 mg/dL  Hepatic function panel  Result Value Ref Range   Alkaline Phosphatase 94 25 - 125 U/L   ALT 42 (A) 7 - 35 U/L   AST 37 (A) 13 - 35 U/L   Bilirubin, Total 0.2 mg/dL  TSH  Result Value Ref Range   TSH 0.74 .41 - 5.90 uIU/mL      Assessment & Plan:   Problem List Items Addressed This Visit      Cardiovascular and Mediastinum   Essential hypertension - Primary (Chronic)    Try DASH guidelines; BP high today b/c patient has been out of medicines for 6 days; refills provided; limit NSAIDs; smoking cessation in AVS; return in 2 weeks for recheck      Relevant Medications   atenolol (TENORMIN) 50 MG tablet   hydrochlorothiazide (HYDRODIURIL) 25 MG tablet   rosuvastatin (CRESTOR) 10 MG tablet     Musculoskeletal and Integument   Cervical disc disorder  with radiculopathy of cervical region    Continue naproxen if needed and flexeril at night; no radicullpathy        Other   Elevated lipids    Check lipids; try limiting saturated fats; will stop gemfibrozil and likely increase Crestor but will check labs today; if unfavorable, increase crestor from 10 mg to 20 mg      Relevant Orders   Lipid Panel w/o Chol/HDL Ratio   Colon cancer screening    Refer to GI      Relevant Orders   Ambulatory referral to Gastroenterology   Medication monitoring encounter   Relevant Orders   Comprehensive metabolic panel    Other Visit Diagnoses    Shoulder pain, acute, left        tylenol would be more favorable than NSAID; muscle relaxant        Follow up plan: Return in about 2 weeks (around 01/07/2016) for recheck of your blood pressure; 6 months for HTN and cholesterol.  An after-visit summary was printed and given to the patient at check-out.  Please see the patient instructions which may contain other information and recommendations beyond what is mentioned above in the assessment and plan.  Meds ordered this encounter  Medications  . atenolol (TENORMIN) 50 MG tablet    Sig: Take 1 tablet (50 mg total) by mouth 2 (two) times daily.    Dispense:  60 tablet    Refill:  5  . hydrochlorothiazide (HYDRODIURIL) 25 MG tablet    Sig: Take 1 tablet (25 mg total) by mouth daily.    Dispense:  30 tablet    Refill:  5  . cyclobenzaprine (FLEXERIL) 10 MG tablet    Sig: Take 1 tablet (10 mg total) by mouth at bedtime. prn    Dispense:  30 tablet    Refill:  2  . naproxen (NAPROSYN) 250 MG tablet    Sig: Take 1 tablet (250 mg total) by mouth 2 (two) times daily with a meal. PRN    Dispense:  60 tablet    Refill:  2  . rosuvastatin (CRESTOR) 10 MG tablet    Sig: Take 1 tablet (10 mg total) by mouth at bedtime.    Dispense:  30 tablet    Refill:  2    Orders Placed This Encounter  Procedures  . Comprehensive metabolic panel  . Lipid Panel  w/o Chol/HDL Ratio  . Ambulatory referral to Gastroenterology

## 2015-12-25 LAB — COMPREHENSIVE METABOLIC PANEL
ALBUMIN: 4.1 g/dL (ref 3.5–5.5)
ALT: 36 IU/L — AB (ref 0–32)
AST: 32 IU/L (ref 0–40)
Albumin/Globulin Ratio: 1.6 (ref 1.2–2.2)
Alkaline Phosphatase: 78 IU/L (ref 39–117)
BUN / CREAT RATIO: 13 (ref 9–23)
BUN: 9 mg/dL (ref 6–24)
CALCIUM: 8.9 mg/dL (ref 8.7–10.2)
CHLORIDE: 101 mmol/L (ref 96–106)
CO2: 24 mmol/L (ref 18–29)
Creatinine, Ser: 0.7 mg/dL (ref 0.57–1.00)
GFR, EST AFRICAN AMERICAN: 118 mL/min/{1.73_m2} (ref >59)
GFR, EST NON AFRICAN AMERICAN: 103 mL/min/{1.73_m2} (ref >59)
GLUCOSE: 82 mg/dL (ref 65–99)
Globulin, Total: 2.5 g/dL (ref 1.5–4.5)
Potassium: 3.3 mmol/L — ABNORMAL LOW (ref 3.5–5.2)
Sodium: 141 mmol/L (ref 134–144)
TOTAL PROTEIN: 6.6 g/dL (ref 6.0–8.5)

## 2016-01-03 IMAGING — MR MRI CERVICAL SPINE WITHOUT CONTRAST
5 series · 42 of 48 positions shown · non-contrast
Comparison: Cervical spine radiographs 04/28/2014.

CLINICAL DATA: 48-year-old female with cervical neck pain and
stiffness. Pain radiating to the left shoulder with left arm
numbness for 6 months. Initial encounter.

EXAM:
MRI CERVICAL SPINE WITHOUT CONTRAST
TECHNIQUE: Multiplanar, multisequence MR imaging of the cervical spine was
performed. No intravenous contrast was administered.

[Series 3: T2 · sagittal · 3.0mm · 0.70mm/px · 6 of 15 slices shown (1 of 2)]
[im 1/15]
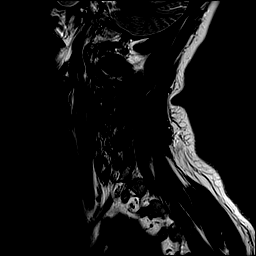
[im 3/15]
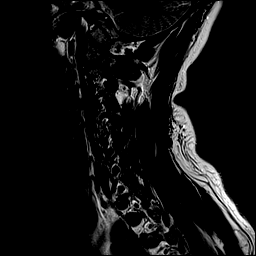
[im 6/15]
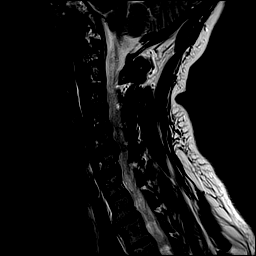
[im 9/15]
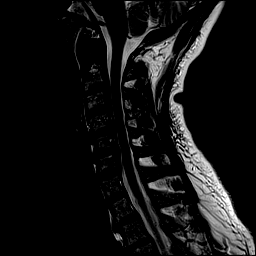
[im 12/15]
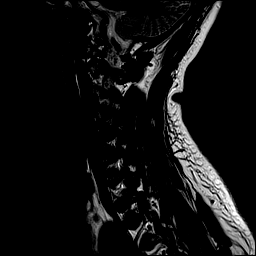
[im 15/15]
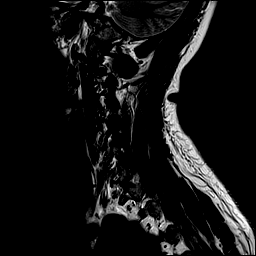

[Series 4: T1 · sagittal · 3.0mm · 0.70mm/px · 7 of 15 slices shown]
[im 1/15]
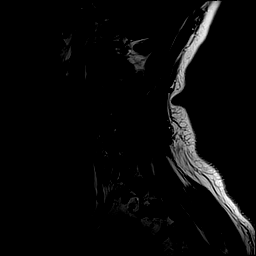
[im 3/15]
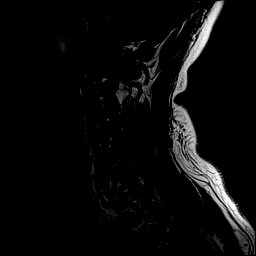
[im 5/15]
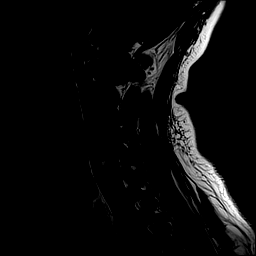
[im 8/15]
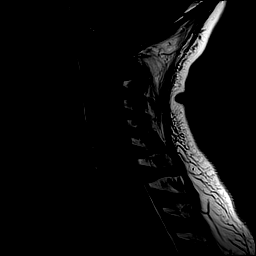
[im 10/15]
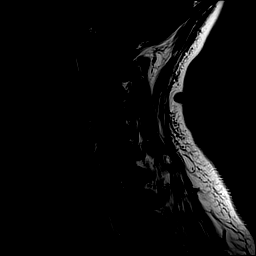
[im 12/15]
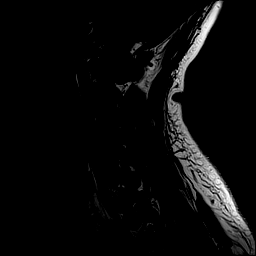
[im 15/15]
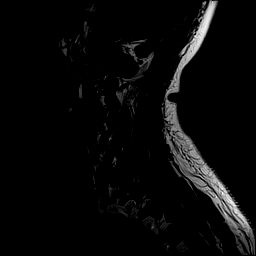

[Series 5: STIR · sagittal · 3.0mm · 0.78mm/px · 7 of 15 slices shown]
[im 1/15]
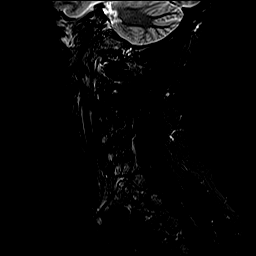
[im 3/15]
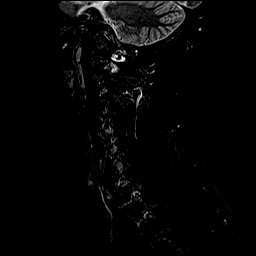
[im 5/15]
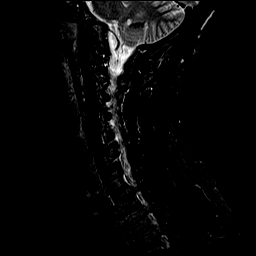
[im 8/15]
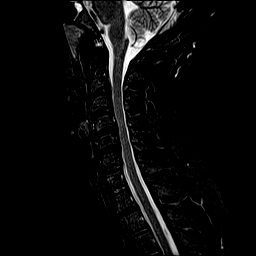
[im 10/15]
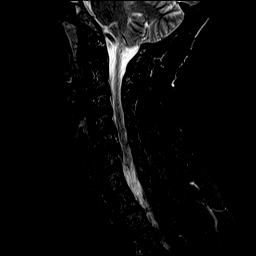
[im 12/15]
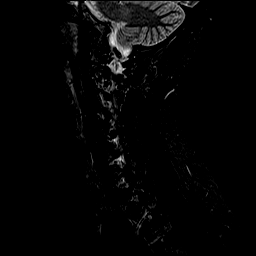
[im 15/15]
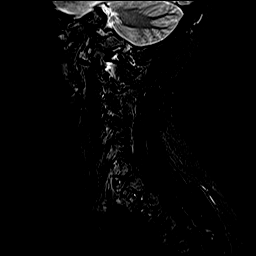

[Series 6: T2 · axial · 3.0mm · 0.70mm/px · z∈[-84,+25]mm · 14 of 30 slices shown (2 of 2)]
[im 1/30]
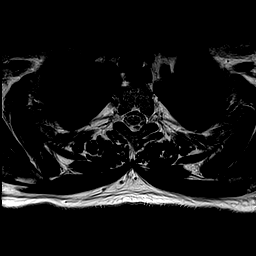
[im 3/30]
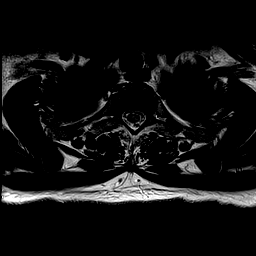
[im 5/30]
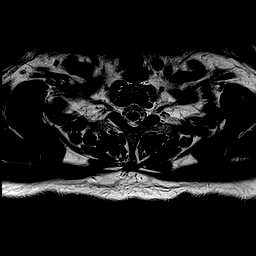
[im 7/30]
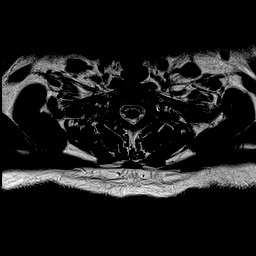
[im 9/30]
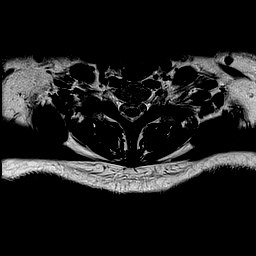
[im 12/30]
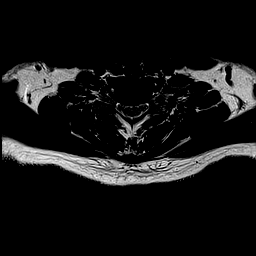
[im 14/30]
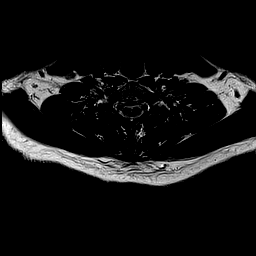
[im 16/30]
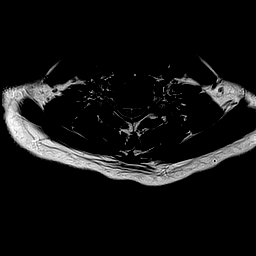
[im 18/30]
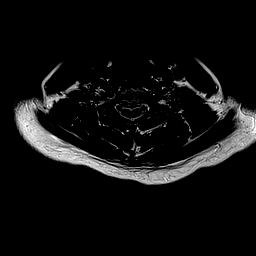
[im 21/30]
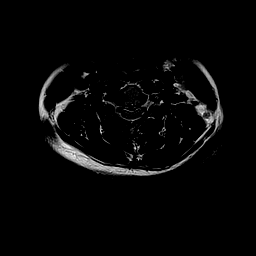
[im 23/30]
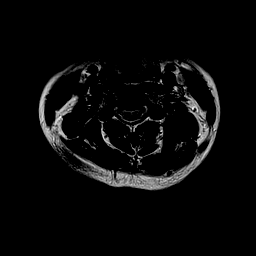
[im 25/30]
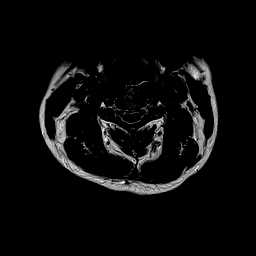
[im 27/30]
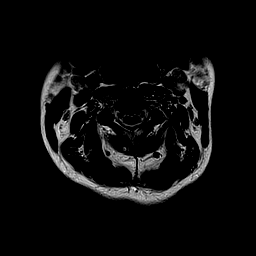
[im 30/30]
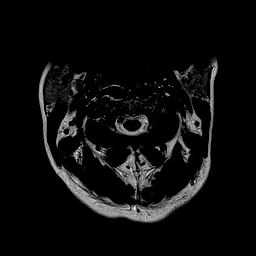

[Series 7: mpgr ax · axial · 3.0mm · 0.35mm/px · z∈[-84,+25]mm · 8 of 30 slices shown]
[im 1/30]
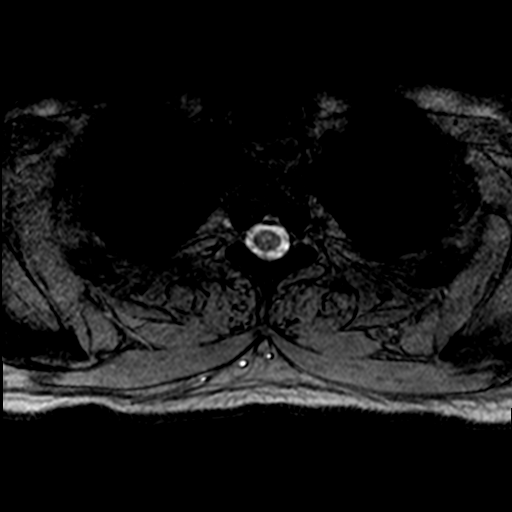
[im 5/30]
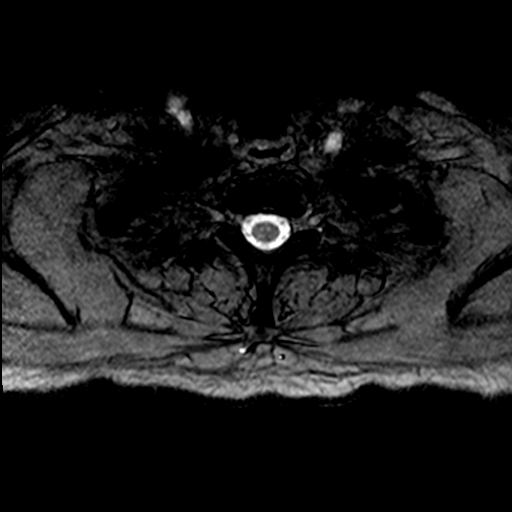
[im 9/30]
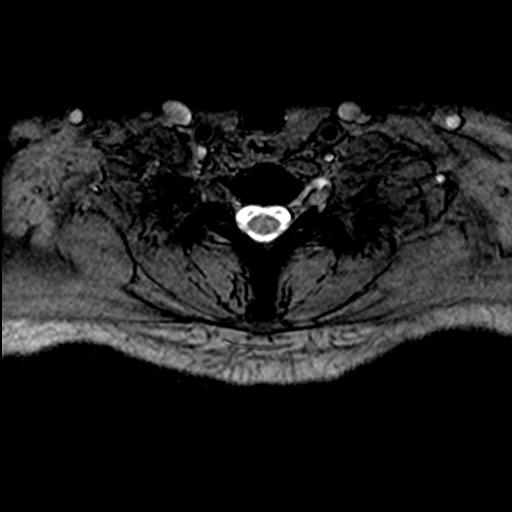
[im 14/30]
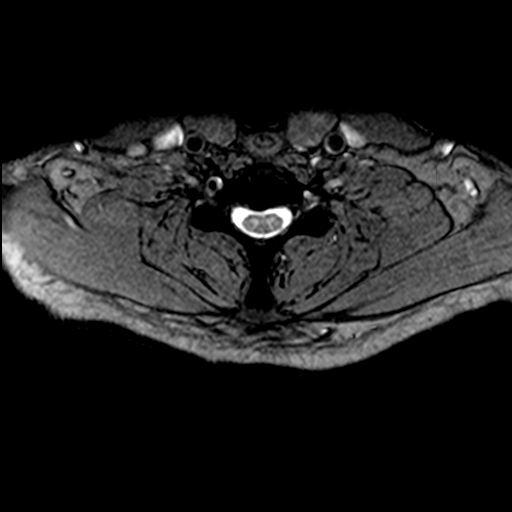
[im 16/30]
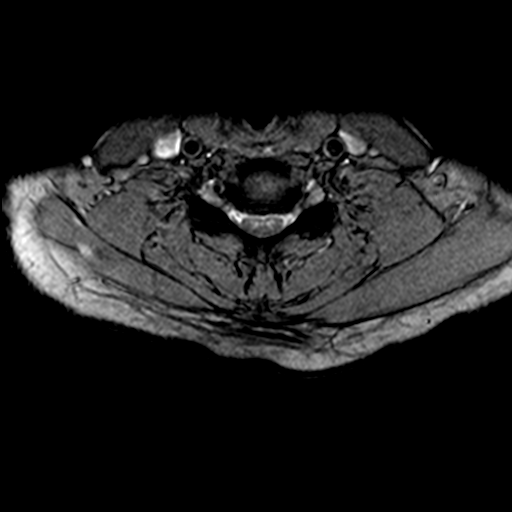
[im 21/30]
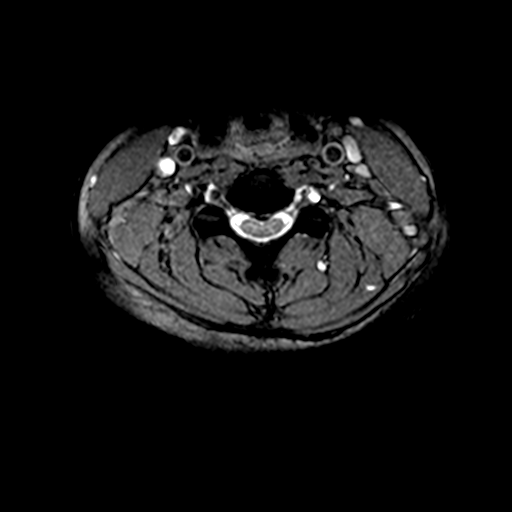
[im 25/30]
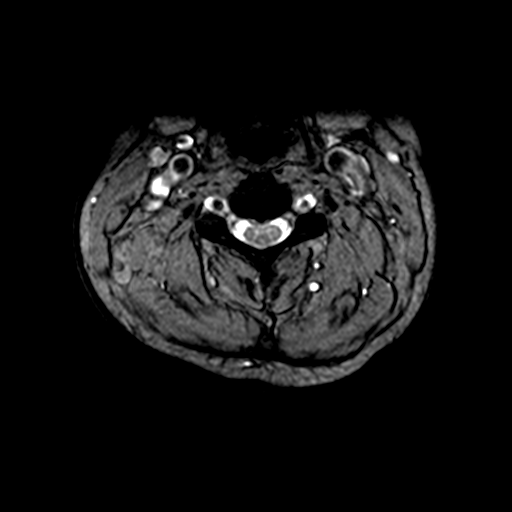
[im 30/30]
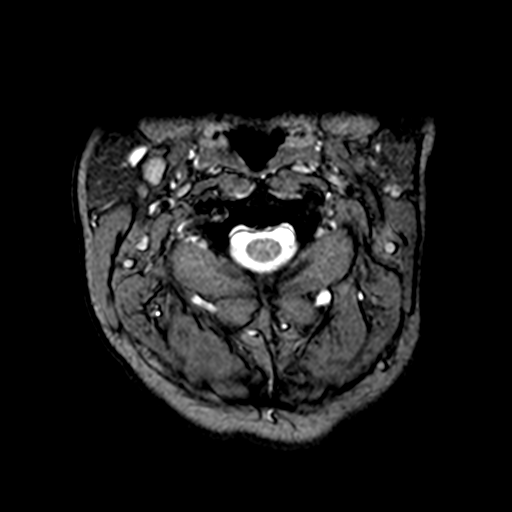

[42 of 48 positions shown; findings below may reference images not displayed]

FINDINGS: Straightening of cervical lordosis. Mild degenerative appearing C6
level superior endplate marrow edema. No acute osseous abnormality
identified.

Cervicomedullary junction is within normal limits.

No spinal cord signal abnormality identified.

Negative paraspinal soft tissues.  Dominant right vertebral artery.

C2-C3: Small central disc protrusion. Mild spinal stenosis (series
7, image 4). No spinal cord mass effect.

C3-C4: Broad-based central disc protrusion (series 7, image 9). Mild
spinal stenosis with no definite spinal cord mass effect.

C4-C5: Circumferential disc bulge with broad-based posterior
component of disc extrusion best seen on series 7, image 12. Spinal
stenosis with flattening of the ventral spinal cord. Mild
uncovertebral and disc material in the neural foramina greater on
the right. Moderate right C5 foraminal stenosis.

C5-C6: Circumferential disc osteophyte complex. Broad-based
posterior, mildly lobulated component of disc. Spinal stenosis with
mild spinal cord flattening. No cord signal abnormality. Moderate
bilateral C6 foraminal stenosis.

C6-C7:  Negative.

C7-T1:  Mild facet hypertrophy, otherwise negative.

There is some upper thoracic disc and facet degeneration, but No
upper thoracic spinal stenosis.
IMPRESSION: 1. Cervical spinal stenosis from C2-C3 to C4-C5 primarily due to
disc herniations and most pronounced at the latter. Spinal cord mass
effect with no cord signal abnormality.
2. Multifactorial moderate right C5 and bilateral C6 neural
foraminal stenosis.

## 2016-01-04 NOTE — Addendum Note (Signed)
Addended by: Elenora GammaHOLT, LORRIE H on: 01/04/2016 02:49 PM   Modules accepted: Orders, SmartSet

## 2016-01-05 ENCOUNTER — Other Ambulatory Visit: Payer: Self-pay

## 2016-03-04 ENCOUNTER — Emergency Department
Admission: EM | Admit: 2016-03-04 | Discharge: 2016-03-04 | Disposition: A | Discharge status: Home / Self Care | Payer: Self-pay | Attending: Emergency Medicine | Admitting: Emergency Medicine

## 2016-03-04 ENCOUNTER — Encounter: Payer: Self-pay | Admitting: Emergency Medicine

## 2016-03-04 DIAGNOSIS — I10 Essential (primary) hypertension: Secondary | ICD-10-CM | POA: Insufficient documentation

## 2016-03-04 DIAGNOSIS — F17210 Nicotine dependence, cigarettes, uncomplicated: Secondary | ICD-10-CM | POA: Insufficient documentation

## 2016-03-04 DIAGNOSIS — J029 Acute pharyngitis, unspecified: Secondary | ICD-10-CM | POA: Insufficient documentation

## 2016-03-04 DIAGNOSIS — Z79899 Other long term (current) drug therapy: Secondary | ICD-10-CM | POA: Insufficient documentation

## 2016-03-04 LAB — POCT RAPID STREP A: Streptococcus, Group A Screen (Direct): NEGATIVE

## 2016-03-04 MED ORDER — AMOXICILLIN-POT CLAVULANATE 875-125 MG PO TABS
1.0000 | ORAL_TABLET | Freq: Once | ORAL | Status: AC
  Administered 2016-03-04: 1 via ORAL
  Filled 2016-03-04: qty 1

## 2016-03-04 MED ORDER — AMOXICILLIN-POT CLAVULANATE 875-125 MG PO TABS: 1.0000 | ORAL_TABLET | Freq: Two times a day (BID) | ORAL | Status: AC

## 2016-03-04 NOTE — ED Provider Notes (Signed)
[facility] Emergency Department Provider Note  ____________________________________________  Time seen: 6:15 AM  I have reviewed the triage vital signs and the nursing notes.   HISTORY  Chief Complaint Sore Throat     HPI Erica Moreno is a 50 y.o. female presents with sore throat times one day with discomfort swallowing. Patient denies any fever afebrile presentation temperature 98.1. Patient denies any cough.     Past Medical History  Diagnosis Date  . Hypertension     Patient Active Problem List   Diagnosis Date Noted  . Colon cancer screening 12/24/2015  . Medication monitoring encounter 12/24/2015  . Essential hypertension 04/18/2015  . 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 Elevated lipids 04/18/2015    Past Surgical History  Procedure Laterality Date  . Appendectomy  06/2013  . Cesarean section  1988    Current Outpatient Rx  Name  Route  Sig  Dispense  Refill  . amoxicillin-clavulanate (AUGMENTIN) 875-125 MG tablet   Oral   Take 1 tablet by mouth 2 (two) times daily.   20 tablet   0   . atenolol (TENORMIN) 50 MG tablet   Oral   Take 1 tablet (50 mg total) by mouth 2 (two) times daily.   60 tablet   5   . cyclobenzaprine (FLEXERIL) 10 MG tablet   Oral   Take 1 tablet (10 mg total) by mouth at bedtime. prn   30 tablet   2   . hydrochlorothiazide (HYDRODIURIL) 25 MG tablet   Oral   Take 1 tablet (25 mg total) by mouth daily.   30 tablet   5   . naproxen (NAPROSYN) 250 MG tablet   Oral   Take 1 tablet (250 mg total) by mouth 2 (two) times daily with a meal. PRN   60 tablet   2   . rosuvastatin (CRESTOR) 10 MG tablet   Oral   Take 1 tablet (10 mg total) by mouth at bedtime.   30 tablet   2     Allergies Aspirin  Family History  Problem Relation Age of Onset  . Heart failure Mother   . Aneurysm Father   . Renal Disease Father     Social History Social History   Substance Use Topics  . Smoking status: Current Every Day Smoker -- 0.25 packs/day    Types: Cigarettes    Last Attempt to Quit: 01/16/2015  . Smokeless tobacco: None  . Alcohol Use: No    Review of Systems  Constitutional: Negative for fever. Eyes: Negative for visual changes. ENT: Positive for sore throat. Cardiovascular: Negative for chest pain. Respiratory: Negative for shortness of breath. Gastrointestinal: Negative for abdominal pain, vomiting and diarrhea. Genitourinary: Negative for dysuria. Musculoskeletal: Negative for back pain. Skin: Negative for rash. Neurological: Negative for headaches, focal weakness or numbness.  10-point ROS otherwise negative.  ____________________________________________   PHYSICAL EXAM:  VITAL SIGNS: ED Triage Vitals  Enc Vitals Group     BP 03/04/16 0536 124/70 mmHg     Pulse Rate 03/04/16 0536 79     Resp 03/04/16 0536 18     Temp 03/04/16 0536 98.1 F (36.7 C)     Temp Source 03/04/16 0536 Oral     SpO2 03/04/16 0536 97 %     Weight 03/04/16 0536 158 lb (71.668 kg)     Height 03/04/16 0536 5\' 5"  (1.651 m)     Head Cir --  Peak Flow --      Pain Score 03/04/16 0536 9     Pain Loc --      Pain Edu? --      Excl. in GC? --      Constitutional: Alert and oriented. Well appearing and in no distress. Eyes: Conjunctivae are normal. PERRL. Normal extraocular movements. ENT   Head: Normocephalic and atraumatic.   Nose: No congestion/rhinnorhea.   Mouth/Throat: Mucous membranes are moist.Pharyngeal erythema scant exudate noted   Neck: No stridor. Hematological/Lymphatic/Immunilogical: No cervical lymphadenopathy. Cardiovascular: Normal rate, regular rhythm. Normal and symmetric distal pulses are present in all extremities. No murmurs, rubs, or gallops. Respiratory: Normal respiratory effort without tachypnea nor retractions. Breath sounds are clear and equal bilaterally. No  wheezes/rales/rhonchi. Gastrointestinal: Soft and nontender. No distention. There is no CVA tenderness. Genitourinary: deferred Musculoskeletal: Nontender with normal range of motion in all extremities. No joint effusions.  No lower extremity tenderness nor edema. Neurologic:  Normal speech and language. No gross focal neurologic deficits are appreciated. Speech is normal.  Skin:  Skin is warm, dry and intact. No rash noted. Psychiatric: Mood and affect are normal. Speech and behavior are normal. Patient exhibits appropriate insight and judgment.  ____________________________________________    LABS (pertinent positives/negatives)  Labs Reviewed  CULTURE, GROUP A STREP [facility]       Procedures    INITIAL IMPRESSION / ASSESSMENT AND PLAN / ED COURSE  Pertinent labs & imaging results that were available during my care of the patient were reviewed by me and considered in my medical decision making (see chart for details).  Patient given Augmentin and sent 5 mg will be prescribed same for home.  ____________________________________________   FINAL CLINICAL IMPRESSION(S) / ED DIAGNOSES  Final diagnoses:  Pharyngitis      Darci Currentandolph N Brown, MD 03/04/16 [phone]

## 2016-03-04 NOTE — Discharge Instructions (Signed)

## 2016-03-04 NOTE — ED Notes (Signed)
Pt c/o sore throat since yesterday, reports painful to swallow. Denies fever or cough.

## 2016-03-06 LAB — CULTURE, GROUP A STREP (THRC)

## 2016-03-25 ENCOUNTER — Other Ambulatory Visit: Payer: Self-pay | Admitting: Family Medicine

## 2016-05-05 ENCOUNTER — Telehealth: Payer: Self-pay

## 2016-05-05 ENCOUNTER — Ambulatory Visit: Payer: Self-pay | Admitting: Urology

## 2016-05-05 VITALS — BP 158/87 | HR 76 | Ht 65.0 in | Wt 156.0 lb

## 2016-05-05 DIAGNOSIS — M4802 Spinal stenosis, cervical region: Secondary | ICD-10-CM

## 2016-05-05 MED ORDER — HYDROCHLOROTHIAZIDE 25 MG PO TABS: 25.0000 mg | ORAL_TABLET | Freq: Every day | ORAL | 5 refills | Status: DC

## 2016-05-05 MED ORDER — ROSUVASTATIN CALCIUM 10 MG PO TABS: 10.0000 mg | ORAL_TABLET | Freq: Every day | ORAL | 12 refills | Status: DC

## 2016-05-05 MED ORDER — NAPROXEN 250 MG PO TABS: 250.0000 mg | ORAL_TABLET | Freq: Two times a day (BID) | ORAL | 2 refills | Status: DC

## 2016-05-05 MED ORDER — ATENOLOL 50 MG PO TABS: 50.0000 mg | ORAL_TABLET | Freq: Two times a day (BID) | ORAL | 5 refills | Status: DC

## 2016-05-05 MED ORDER — CYCLOBENZAPRINE HCL 10 MG PO TABS: 10.0000 mg | ORAL_TABLET | Freq: Every day | ORAL | 2 refills | Status: DC

## 2016-05-05 NOTE — Telephone Encounter (Signed)
Called pt to schedule appt. Appt made for 05/05/2016.

## 2016-05-05 NOTE — Progress Notes (Signed)
BP (!) 158/87   Pulse 76   Ht 5\' 5"  (1.651 m)   Wt 156 lb (70.8 kg)   BMI 25.96 kg/m    Subjective:    Patient ID: Erica Moreno, female    DOB: [birth year], 50 y.o.   MRN: [id number]  HPI: Erica Moreno is a 50 y.o. female   Chief Complaint  Patient presents with  . Neck Pain    Patient continues to have pain in the left shoulder; has used flexeril, especially when working too much; spasms in back and shoulder; tried PT and continued the exercises at home and found no relief, flexeril and naproxen  She has been out of her blood pressure medicine for about 6 days; mother and grandmother and aunt has HTN, heart disease;   High cholesterol no myopathy; urine normal; occasionally eats eggs; chicken eater; checking lipids   Relevant past medical, surgical, family and social history reviewed and updated as indicated. Past Medical History:  Diagnosis Date  . Hypertension    Past Surgical History:  Procedure Laterality Date  . APPENDECTOMY  06/2013  . CESAREAN SECTION  1988   Family History  Problem Relation Age of Onset  . Heart failure Mother   . Aneurysm Father   . Renal Disease Father    Social History  Substance Use Topics  . Smoking status: Current Every Day Smoker    Packs/day: 0.25    Types: Cigarettes    Last attempt to quit: 01/16/2015  . Smokeless tobacco: Not on file  . Alcohol use No   Interim medical history since last visit reviewed. Allergies and medications reviewed and updated.  Review of Systems Per HPI unless specifically indicated above     Objective:    BP (!) 158/87   Pulse 76   Ht 5\' 5"  (1.651 m)   Wt 156 lb (70.8 kg)   BMI 25.96 kg/m   Wt Readings from Last 3 Encounters:  05/05/16 156 lb (70.8 kg)  03/04/16 158 lb (71.7 kg)  12/24/15 158 lb (71.7 kg)    Physical Exam  Constitutional: She appears well-developed and well-nourished. No distress.  HENT:  Head: Normocephalic and atraumatic.  Eyes: EOM are normal. No scleral  icterus.  Neck: No JVD present.  Cardiovascular: Normal rate and regular rhythm.   Pulmonary/Chest: Effort normal and breath sounds normal.  Abdominal: She exhibits no distension. There is no tenderness.  Musculoskeletal: She exhibits no edema.       Right shoulder: She exhibits tenderness (tender over the upper posterior right shoulder, lateral rhomboid, trap, tender to touch, reproduces pain).  Neurological: She is alert.  Skin: Skin is warm. No pallor.  Psychiatric: She has a normal mood and affect.    Results for orders placed or performed during the hospital encounter of 03/04/16  Culture, group A strep  Result Value Ref Range   Specimen Description THROAT    Special Requests NONE    Culture      NO GROUP A STREP (S.PYOGENES) ISOLATED Performed at [facility]    Report Status 03/06/2016 FINAL   POCT rapid strep A [facility])  Result Value Ref Range   Streptococcus, Group A Screen (Direct) NEGATIVE NEGATIVE      Assessment & Plan:   Problem List Items Addressed This Visit    None    Visit Diagnoses    Cervical stenosis of spine    -  Primary   Relevant Orders   MR Cervical Spine W  Contrast   CBC with Differential/Platelet   Comprehensive metabolic panel   Hemoglobin A1c   TSH   Lipid panel   Urinalysis, Complete       Follow up plan: Return in about 1 month (around 06/04/2016).  An after-visit summary was printed and given to the patient at check-out.  Please see the patient instructions which may contain other information and recommendations beyond what is mentioned above in the assessment and plan.  Meds ordered this encounter  Medications  . rosuvastatin (CRESTOR) 10 MG tablet    Sig: Take 1 tablet (10 mg total) by mouth at bedtime.    Dispense:  30 tablet    Refill:  12    Order Specific Question:   Supervising Provider    Answer:   Dorothey BasemanBRONSTEIN, DAVID [id number]]  . naproxen (NAPROSYN) 250 MG tablet    Sig: Take 1 tablet (250 mg total) by  mouth 2 (two) times daily with a meal. PRN    Dispense:  60 tablet    Refill:  2    Order Specific Question:   Supervising Provider    Answer:   Marcial PacasBRONSTEIN, DAVID [[id number]]  . hydrochlorothiazide (HYDRODIURIL) 25 MG tablet    Sig: Take 1 tablet (25 mg total) by mouth daily.    Dispense:  30 tablet    Refill:  5    Order Specific Question:   Supervising Provider    Answer:   Dorothey BasemanBRONSTEIN, DAVID [id number]]  . cyclobenzaprine (FLEXERIL) 10 MG tablet    Sig: Take 1 tablet (10 mg total) by mouth at bedtime. prn    Dispense:  30 tablet    Refill:  2    Order Specific Question:   Supervising Provider    Answer:   Dorothey BasemanBRONSTEIN, DAVID [id number]]  . atenolol (TENORMIN) 50 MG tablet    Sig: Take 1 tablet (50 mg total) by mouth 2 (two) times daily.    Dispense:  60 tablet    Refill:  5    Order Specific Question:   Supervising Provider    Answer:   Marcial PacasBRONSTEIN, DAVID [[id number]]    Orders Placed This Encounter  Procedures  . MR Cervical Spine W Contrast  . CBC with Differential/Platelet  . Comprehensive metabolic panel  . Hemoglobin A1c  . TSH  . Lipid panel  . Urinalysis, Complete    1. Cervical stenosis  - repeat MRI  - refill flexeril and naproxsyn  2. HLD  - check lipids 3. HTN  - refilled meds  - check CBC, TSH, HbgA1c,

## 2016-05-06 LAB — COMPREHENSIVE METABOLIC PANEL
A/G RATIO: 1.4 (ref 1.2–2.2)
ALK PHOS: 82 IU/L (ref 39–117)
ALT: 16 IU/L (ref 0–32)
AST: 18 IU/L (ref 0–40)
Albumin: 4.4 g/dL (ref 3.5–5.5)
BUN/Creatinine Ratio: 10 (ref 9–23)
BUN: 6 mg/dL (ref 6–24)
Bilirubin Total: <0.2 mg/dL (ref 0.0–1.2)
CALCIUM: 9.5 mg/dL (ref 8.7–10.2)
CHLORIDE: 97 mmol/L (ref 96–106)
CO2: 21 mmol/L (ref 18–29)
Creatinine, Ser: 0.62 mg/dL (ref 0.57–1.00)
GFR calc non Af Amer: 107 mL/min/{1.73_m2} (ref >59)
GFR, EST AFRICAN AMERICAN: 123 mL/min/{1.73_m2} (ref >59)
Globulin, Total: 3.1 g/dL (ref 1.5–4.5)
Glucose: 93 mg/dL (ref 65–99)
POTASSIUM: 3.4 mmol/L — AB (ref 3.5–5.2)
Sodium: 139 mmol/L (ref 134–144)
TOTAL PROTEIN: 7.5 g/dL (ref 6.0–8.5)

## 2016-05-06 LAB — CBC WITH DIFFERENTIAL/PLATELET
BASOS: 0 %
Basophils Absolute: 0 10*3/uL (ref 0.0–0.2)
EOS (ABSOLUTE): 0.2 10*3/uL (ref 0.0–0.4)
EOS: 2 %
Hematocrit: 38.1 % (ref 34.0–46.6)
Hemoglobin: 12.6 g/dL (ref 11.1–15.9)
IMMATURE GRANS (ABS): 0 10*3/uL (ref 0.0–0.1)
IMMATURE GRANULOCYTES: 0 %
LYMPHS: 39 %
Lymphocytes Absolute: 3.6 10*3/uL — ABNORMAL HIGH (ref 0.7–3.1)
MCH: 27.8 pg (ref 26.6–33.0)
MCHC: 33.1 g/dL (ref 31.5–35.7)
MCV: 84 fL (ref 79–97)
Monocytes Absolute: 0.5 10*3/uL (ref 0.1–0.9)
Monocytes: 5 %
NEUTROS PCT: 54 %
Neutrophils Absolute: 5 10*3/uL (ref 1.4–7.0)
PLATELETS: 325 10*3/uL (ref 150–379)
RBC: 4.54 x10E6/uL (ref 3.77–5.28)
RDW: 14.8 % (ref 12.3–15.4)
WBC: 9.3 10*3/uL (ref 3.4–10.8)

## 2016-05-06 LAB — MICROSCOPIC EXAMINATION
BACTERIA UA: NONE SEEN
Casts: NONE SEEN /lpf

## 2016-05-06 LAB — URINALYSIS, COMPLETE
BILIRUBIN UA: NEGATIVE
GLUCOSE, UA: NEGATIVE
Ketones, UA: NEGATIVE
LEUKOCYTES UA: NEGATIVE
Nitrite, UA: NEGATIVE
Protein, UA: NEGATIVE
RBC UA: NEGATIVE
SPEC GRAV UA: 1.006 (ref 1.005–1.030)
UUROB: 0.2 mg/dL (ref 0.2–1.0)
pH, UA: 7.5 (ref 5.0–7.5)

## 2016-05-06 LAB — HEMOGLOBIN A1C
Est. average glucose Bld gHb Est-mCnc: 114 mg/dL
HEMOGLOBIN A1C: 5.6 % (ref 4.8–5.6)

## 2016-05-06 LAB — LIPID PANEL
CHOLESTEROL TOTAL: 243 mg/dL — AB (ref 100–199)
Chol/HDL Ratio: 7.8 ratio units — ABNORMAL HIGH (ref 0.0–4.4)
HDL: 31 mg/dL — ABNORMAL LOW (ref >39)
Triglycerides: 765 mg/dL (ref 0–149)

## 2016-05-06 LAB — TSH: TSH: 0.472 u[IU]/mL (ref 0.450–4.500)

## 2016-06-02 ENCOUNTER — Ambulatory Visit: Payer: Self-pay

## 2016-06-23 ENCOUNTER — Ambulatory Visit: Payer: Self-pay | Admitting: Family Medicine

## 2016-06-23 VITALS — BP 151/83 | HR 80 | Ht 66.0 in | Wt 160.0 lb

## 2016-06-23 DIAGNOSIS — E781 Pure hyperglyceridemia: Secondary | ICD-10-CM

## 2016-06-23 DIAGNOSIS — R0989 Other specified symptoms and signs involving the circulatory and respiratory systems: Secondary | ICD-10-CM

## 2016-06-23 DIAGNOSIS — E785 Hyperlipidemia, unspecified: Secondary | ICD-10-CM

## 2016-06-23 DIAGNOSIS — I10 Essential (primary) hypertension: Secondary | ICD-10-CM

## 2016-06-23 DIAGNOSIS — N920 Excessive and frequent menstruation with regular cycle: Secondary | ICD-10-CM | POA: Insufficient documentation

## 2016-06-23 DIAGNOSIS — N921 Excessive and frequent menstruation with irregular cycle: Secondary | ICD-10-CM

## 2016-06-23 DIAGNOSIS — Z720 Tobacco use: Secondary | ICD-10-CM

## 2016-06-23 MED ORDER — AMLODIPINE BESYLATE 2.5 MG PO TABS: 2.5000 mg | ORAL_TABLET | Freq: Every day | ORAL | 5 refills | Status: DC

## 2016-06-23 MED ORDER — ICOSAPENT ETHYL 1 G PO CAPS: 2.0000 | ORAL_CAPSULE | Freq: Two times a day (BID) | ORAL | 5 refills | Status: DC

## 2016-06-23 MED ORDER — VARENICLINE TARTRATE 1 MG PO TABS: 1.0000 mg | ORAL_TABLET | Freq: Two times a day (BID) | ORAL | 3 refills | Status: AC

## 2016-06-23 MED ORDER — VARENICLINE TARTRATE 0.5 MG X 11 & 1 MG X 42 PO MISC: ORAL | 0 refills | Status: AC

## 2016-06-23 NOTE — Patient Instructions (Addendum)
I do encourage you to quit smoking Call [phone] to sign up for smoking cessation classes You can call 1-800-QUIT-NOW to talk with a smoking cessation coach  Start the Chantix Your goal blood pressure is less than 140 mmHg on top. Try to follow the DASH guidelines (DASH stands for Dietary Approaches to Stop Hypertension) Try to limit the sodium in your diet.  Ideally, consume less than 1.5 grams (less than 1,500mg ) per day. Do not add salt when cooking or at the table.  Check the sodium amount on labels when shopping, and choose items lower in sodium when given a choice. Avoid or limit foods that already contain a lot of sodium. Eat a diet rich in fruits and vegetables and whole grains.  Add the amlodipine to current blood pressure medicines  DASH Eating Plan DASH stands for "Dietary Approaches to Stop Hypertension." The DASH eating plan is a healthy eating plan that has been shown to reduce high blood pressure (hypertension). Additional health benefits may include reducing the risk of type 2 diabetes mellitus, heart disease, and stroke. The DASH eating plan may also help with weight loss. WHAT DO I NEED TO KNOW ABOUT THE DASH EATING PLAN? For the DASH eating plan, you will follow these general guidelines:  Choose foods with a percent daily value for sodium of less than 5% (as listed on the food label).  Use salt-free seasonings or herbs instead of table salt or sea salt.  Check with your health care provider or pharmacist before using salt substitutes.  Eat lower-sodium products, often labeled as "lower sodium" or "no salt added."  Eat fresh foods.  Eat more vegetables, fruits, and low-fat dairy products.  Choose whole grains. Look for the word "whole" as the first word in the ingredient list.  Choose fish and skinless chicken or [place] more often than red meat. Limit fish, poultry, and meat to 6 oz (170 g) each day.  Limit sweets, desserts, sugars, and sugary drinks.  Choose  heart-healthy fats.  Limit cheese to 1 oz (28 g) per day.  Eat more home-cooked food and less restaurant, buffet, and fast food.  Limit fried foods.  Cook foods using methods other than frying.  Limit canned vegetables. If you do use them, rinse them well to decrease the sodium.  When eating at a restaurant, ask that your food be prepared with less salt, or no salt if possible. WHAT FOODS CAN I EAT? Seek help from a dietitian for individual calorie needs. Grains Whole grain or whole wheat bread. Brown rice. Whole grain or whole wheat pasta. Quinoa, bulgur, and whole grain cereals. Low-sodium cereals. Corn or whole wheat flour tortillas. Whole grain cornbread. Whole grain crackers. Low-sodium crackers. Vegetables Fresh or frozen vegetables (raw, steamed, roasted, or grilled). Low-sodium or reduced-sodium tomato and vegetable juices. Low-sodium or reduced-sodium tomato sauce and paste. Low-sodium or reduced-sodium canned vegetables.  Fruits All fresh, canned (in natural juice), or frozen fruits. Meat and Other Protein Products Ground beef (85% or leaner), grass-fed beef, or beef trimmed of fat. Skinless chicken or [place]. Ground chicken or [place]. Pork trimmed of fat. All fish and seafood. Eggs. Dried beans, peas, or lentils. Unsalted nuts and seeds. Unsalted canned beans. Dairy Low-fat dairy products, such as skim or 1% milk, 2% or reduced-fat cheeses, low-fat ricotta or cottage cheese, or plain low-fat yogurt. Low-sodium or reduced-sodium cheeses. Fats and Oils Tub margarines without trans fats. Light or reduced-fat mayonnaise and salad dressings (reduced sodium). Avocado. Safflower, olive, or canola oils.  Natural peanut or almond butter. Other Unsalted popcorn and pretzels. The items listed above may not be a complete list of recommended foods or beverages. Contact your dietitian for more options. WHAT FOODS ARE NOT RECOMMENDED? Grains White bread. White pasta. White rice. Refined  cornbread. Bagels and croissants. Crackers that contain trans fat. Vegetables Creamed or fried vegetables. Vegetables in a cheese sauce. Regular canned vegetables. Regular canned tomato sauce and paste. Regular tomato and vegetable juices. Fruits Dried fruits. Canned fruit in light or heavy syrup. Fruit juice. Meat and Other Protein Products Fatty cuts of meat. Ribs, chicken wings, bacon, sausage, bologna, salami, chitterlings, fatback, hot dogs, bratwurst, and packaged luncheon meats. Salted nuts and seeds. Canned beans with salt. Dairy Whole or 2% milk, cream, half-and-half, and cream cheese. Whole-fat or sweetened yogurt. Full-fat cheeses or blue cheese. Nondairy creamers and whipped toppings. Processed cheese, cheese spreads, or cheese curds. Condiments Onion and garlic salt, seasoned salt, table salt, and sea salt. Canned and packaged gravies. Worcestershire sauce. Tartar sauce. Barbecue sauce. Teriyaki sauce. Soy sauce, including reduced sodium. Steak sauce. Fish sauce. Oyster sauce. Cocktail sauce. Horseradish. Ketchup and mustard. Meat flavorings and tenderizers. Bouillon cubes. Hot sauce. Tabasco sauce. Marinades. Taco seasonings. Relishes. Fats and Oils Butter, stick margarine, lard, shortening, ghee, and bacon fat. Coconut, palm kernel, or palm oils. Regular salad dressings. Other Pickles and olives. Salted popcorn and pretzels. The items listed above may not be a complete list of foods and beverages to avoid. Contact your dietitian for more information. WHERE CAN I FIND MORE INFORMATION? National Heart, Lung, and Blood Institute: CableProm[employer].itwww.nhlbi.nih.gov/health/health-topics/topics/dash/   This information is not intended to replace advice given to you by your health care provider. Make sure you discuss any questions you have with your health care provider.   Document Released: 08/04/2011 Document Revised: 09/05/2014 Document Reviewed: 06/19/2013 Elsevier Interactive Patient Education  2016 [employer] Smoking Cessation, Tips for Success If you are ready to quit smoking, congratulations! You have chosen to help yourself be healthier. Cigarettes bring nicotine, tar, carbon monoxide, and other irritants into your body. Your lungs, heart, and blood vessels will be able to work better without these poisons. There are many different ways to quit smoking. Nicotine gum, nicotine patches, a nicotine inhaler, or nicotine nasal spray can help with physical craving. Hypnosis, support groups, and medicines help break the habit of smoking. WHAT THINGS CAN I DO TO MAKE QUITTING EASIER?  Here are some tips to help you quit for good:  Pick a date when you will quit smoking completely. Tell all of your friends and family about your plan to quit on that date.  Do not try to slowly cut down on the number of cigarettes you are smoking. Pick a quit date and quit smoking completely starting on that day.  Throw away all cigarettes.   Clean and remove all ashtrays from your home, work, and car.  On a card, write down your reasons for quitting. Carry the card with you and read it when you get the urge to smoke.  Cleanse your body of nicotine. Drink enough water and fluids to keep your urine clear or pale yellow. Do this after quitting to flush the nicotine from your body.  Learn to predict your moods. Do not let a bad situation be your excuse to have a cigarette. Some situations in your life might tempt you into wanting a cigarette.  Never have "just one" cigarette. It leads to wanting another and another. Remind yourself of your  decision to quit.  Change habits associated with smoking. If you smoked while driving or when feeling stressed, try other activities to replace smoking. Stand up when drinking your coffee. Brush your teeth after eating. Sit in a different chair when you read the paper. Avoid alcohol while trying to quit, and try to drink fewer caffeinated beverages. Alcohol and caffeine  may urge you to smoke.  Avoid foods and drinks that can trigger a desire to smoke, such as sugary or spicy foods and alcohol.  Ask people who smoke not to smoke around you.  Have something planned to do right after eating or having a cup of coffee. For example, plan to take a walk or exercise.  Try a relaxation exercise to calm you down and decrease your stress. Remember, you may be tense and nervous for the first 2 weeks after you quit, but this will pass.  Find new activities to keep your hands busy. Play with a pen, coin, or rubber band. Doodle or draw things on paper.  Brush your teeth right after eating. This will help cut down on the craving for the taste of tobacco after meals. You can also try mouthwash.   Use oral substitutes in place of cigarettes. Try using lemon drops, carrots, cinnamon sticks, or chewing gum. Keep them handy so they are available when you have the urge to smoke.  When you have the urge to smoke, try deep breathing.  Designate your home as a nonsmoking area.  If you are a heavy smoker, ask your health care provider about a prescription for nicotine chewing gum. It can ease your withdrawal from nicotine.  Reward yourself. Set aside the cigarette money you save and buy yourself something nice.  Look for support from others. Join a support group or smoking cessation program. Ask someone at home or at work to help you with your plan to quit smoking.  Always ask yourself, "Do I need this cigarette or is this just a reflex?" Tell yourself, "Today, I choose not to smoke," or "I do not want to smoke." You are reminding yourself of your decision to quit.  Do not replace cigarette smoking with electronic cigarettes (commonly called e-cigarettes). The safety of e-cigarettes is unknown, and some may contain harmful chemicals.  If you relapse, do not give up! Plan ahead and think about what you will do the next time you get the urge to smoke. HOW WILL I FEEL WHEN I  QUIT SMOKING? You may have symptoms of withdrawal because your body is used to nicotine (the addictive substance in cigarettes). You may crave cigarettes, be irritable, feel very hungry, cough often, get headaches, or have difficulty concentrating. The withdrawal symptoms are only temporary. They are strongest when you first quit but will go away within 10-14 days. When withdrawal symptoms occur, stay in control. Think about your reasons for quitting. Remind yourself that these are signs that your body is healing and getting used to being without cigarettes. Remember that withdrawal symptoms are easier to treat than the major diseases that smoking can cause.  Even after the withdrawal is over, expect periodic urges to smoke. However, these cravings are generally short lived and will go away whether you smoke or not. Do not smoke! WHAT RESOURCES ARE AVAILABLE TO HELP ME QUIT SMOKING? Your health care provider can direct you to community resources or hospitals for support, which may include:  Group support.  Education.  Hypnosis.  Therapy.   This information is not intended to  replace advice given to you by your health care provider. Make sure you discuss any questions you have with your health care provider.   Document Released: 05/13/2004 Document Revised: 09/05/2014 Document Reviewed: 01/31/2013 Elsevier Interactive Patient Education [employer]

## 2016-06-23 NOTE — Progress Notes (Signed)
BP (!) 151/83   Pulse 80   Ht 5\' 6"  (1.676 m)   Wt 160 lb (72.6 kg)   SpO2 99%   BMI 25.82 kg/m    Subjective:    Patient ID: Erica Moreno, female    DOB: 04-Nov-1965, 50 y.o.   MRN: [id number]  HPI: Erica Moreno is a 50 y.o. female  Chief Complaint  Patient presents with  . Dysmenorrhea   Patient is here to get a referral to a gynecologist She feels very crampy and very clotty Cycle has not been regular Getting worse over last 6 months Tried motrin and ibuprofen, not helping; tried naproxen Very uncomfortable; affecting quality of life Does feel tired She is asking for a referral to see a gyn  Labs reviewed; noted very high TG; already on Crestor; mother has high cholesterol and has heart disease Patient's last TG 765 on 10 mg Crestor; trying to watch her diet already, more fish and chicken  She has aching and numbness in the left leg, pointed over anteromedial aspect of LEFT thigh, very uncomfortable; she is a smoker; mother has peripheral vascular disease; going on for several days  She smokes 1 ppd; smoking 20 years; she smokes first cigarette of the morning is right away; last cig of the day is right before bed; she might smoke if she gets up to go to the bathroom; habit strong; no smokers in the home Willing to quit now; has never tried Chantix; did try nicotrol inhaler which did not help  She has high blood pressure; checking at home sometimes; BP 129 lowest and sometimes 140 and 155 highest She is really watching salt; really trying to watch diet, chicken and fish Taking NSAIDs; no black licorice, no decongestants  Relevant past medical, surgical, family and social history reviewed Past Medical History:  Diagnosis Date  . Hyperlipidemia   . Hypertension    Past Surgical History:  Procedure Laterality Date  . APPENDECTOMY  06/2013  . CESAREAN SECTION  1988   Family History  Problem Relation Age of Onset  . Heart failure Mother   . Aneurysm Father   .  Renal Disease Father   . Kidney disease Father    Social History  Substance Use Topics  . Smoking status: Current Every Day Smoker    Packs/day: 1.00    Years: 20.00    Types: Cigarettes    Last attempt to quit: 01/16/2015  . Smokeless tobacco: Never Used  . Alcohol use No     Comment: reports EtOH use on special occasions & holidays   Interim medical history since last visit reviewed. Allergies and medications reviewed  Review of Systems Per HPI unless specifically indicated above     Objective:    BP (!) 151/83   Pulse 80   Ht 5\' 6"  (1.676 m)   Wt 160 lb (72.6 kg)   SpO2 99%   BMI 25.82 kg/m   Wt Readings from Last 3 Encounters:  06/23/16 160 lb (72.6 kg)  05/05/16 156 lb (70.8 kg)  03/04/16 158 lb (71.7 kg)    Physical Exam  Constitutional: She appears well-developed and well-nourished.  Cardiovascular: Normal rate.   Nonpalpable dorsalis pedis pulse, trace posterior tibial pulse on the LEFT  Pulmonary/Chest: Effort normal.  Abdominal: She exhibits no distension.  Musculoskeletal: She exhibits no edema.  Neurological: She is alert.  Skin: Skin is warm.  Nailbeds were pink  Psychiatric: Her mood appears not anxious. She does not exhibit a depressed  mood.   Results for orders placed or performed in visit on 05/05/16  Microscopic Examination  Result Value Ref Range   WBC, UA 0-5 0 - 5 /hpf   RBC, UA 0-2 0 - 2 /hpf   Epithelial Cells (non renal) 0-10 0 - 10 /hpf   Casts None seen None seen /lpf   Mucus, UA Present Not Estab.   Bacteria, UA None seen None seen/Few  CBC with Differential/Platelet  Result Value Ref Range   WBC 9.3 3.4 - 10.8 x10E3/uL   RBC 4.54 3.77 - 5.28 x10E6/uL   Hemoglobin 12.6 11.1 - 15.9 g/dL   Hematocrit 40.9 81.1 - 46.6 %   MCV 84 79 - 97 fL   MCH 27.8 26.6 - 33.0 pg   MCHC 33.1 31.5 - 35.7 g/dL   RDW 91.4 78.2 - 95.6 %   Platelets 325 150 - 379 x10E3/uL   Neutrophils 54 %   Lymphs 39 %   Monocytes 5 %   Eos 2 %   Basos 0 %    Neutrophils Absolute 5.0 1.4 - 7.0 x10E3/uL   Lymphocytes Absolute 3.6 (H) 0.7 - 3.1 x10E3/uL   Monocytes Absolute 0.5 0.1 - 0.9 x10E3/uL   EOS (ABSOLUTE) 0.2 0.0 - 0.4 x10E3/uL   Basophils Absolute 0.0 0.0 - 0.2 x10E3/uL   Immature Granulocytes 0 %   Immature Grans (Abs) 0.0 0.0 - 0.1 x10E3/uL  Comprehensive metabolic panel  Result Value Ref Range   Glucose 93 65 - 99 mg/dL   BUN 6 6 - 24 mg/dL   Creatinine, Ser 2.13 0.57 - 1.00 mg/dL   GFR calc non Af Amer 107 >59 mL/min/1.73   GFR calc Af Amer 123 >59 mL/min/1.73   BUN/Creatinine Ratio 10 9 - 23   Sodium 139 134 - 144 mmol/L   Potassium 3.4 (L) 3.5 - 5.2 mmol/L   Chloride 97 96 - 106 mmol/L   CO2 21 18 - 29 mmol/L   Calcium 9.5 8.7 - 10.2 mg/dL   Total Protein 7.5 6.0 - 8.5 g/dL   Albumin 4.4 3.5 - 5.5 g/dL   Globulin, Total 3.1 1.5 - 4.5 g/dL   Albumin/Globulin Ratio 1.4 1.2 - 2.2   Bilirubin Total <0.2 0.0 - 1.2 mg/dL   Alkaline Phosphatase 82 39 - 117 IU/L   AST 18 0 - 40 IU/L   ALT 16 0 - 32 IU/L  Hemoglobin A1c  Result Value Ref Range   Hgb A1c MFr Bld 5.6 4.8 - 5.6 %   Est. average glucose Bld gHb Est-mCnc 114 mg/dL  TSH  Result Value Ref Range   TSH 0.472 0.450 - 4.500 uIU/mL  Lipid panel  Result Value Ref Range   Cholesterol, Total 243 (H) 100 - 199 mg/dL   Triglycerides 086 (HH) 0 - 149 mg/dL   HDL 31 (L) >57 mg/dL   VLDL Cholesterol Cal Comment 5 - 40 mg/dL   LDL Calculated Comment 0 - 99 mg/dL   Chol/HDL Ratio 7.8 (H) 0.0 - 4.4 ratio units  Urinalysis, Complete  Result Value Ref Range   Specific Gravity, UA 1.006 1.005 - 1.030   pH, UA 7.5 5.0 - 7.5   Color, UA Yellow Yellow   Appearance Ur Clear Clear   Leukocytes, UA Negative Negative   Protein, UA Negative Negative/Trace   Glucose, UA Negative Negative   Ketones, UA Negative Negative   RBC, UA Negative Negative   Bilirubin, UA Negative Negative   Urobilinogen, Ur 0.2 0.2 -  1.0 mg/dL   Nitrite, UA Negative Negative   Microscopic Examination  Comment    Microscopic Examination See below:       Assessment & Plan:   Problem List Items Addressed This Visit      Cardiovascular and Mediastinum   Essential hypertension (Chronic)    Not controlled; advised smoking cessation; try DASH guidelines; avoid decongestants; add CCB; recheck BP at visit in 4 weeks      Relevant Medications   amLODipine (NORVASC) 2.5 MG tablet   Icosapent Ethyl (VASCEPA) 1 g CAPS     Other   Tobacco abuse    Spent > 3 minutes talking with patient about smoking, risks of smoking, associated health problems, encouragement given to stop; urged breaking habits; Discussed different forms of smoking cessation with the patient, and I encouraged cessation. Patient requests trial of Chantix. patient opts to try quitting with Chantix. The 1-800-QUIT-NOW number given in patient instructions. Patient was encouraged to choose a quit date about 1 week after starting medicine.      Hypertriglyceridemia    Dietary education, avoid starchy foods and fried foods; continue statin; add vascepa; check lipids in 3 months after starting      Relevant Medications   amLODipine (NORVASC) 2.5 MG tablet   Icosapent Ethyl (VASCEPA) 1 g CAPS   Heavy periods - Primary    Check CBC and TSH; refer to gyn for work-up      Relevant Orders   Ambulatory referral to Gynecology   CBC with Differential/Platelet   TSH   Ferritin   Elevated lipids    Order Vascepa; avoid saturated fats      Decreased pulses in feet    Smoker, high chol, fam hx; refer to vasc; stop smoking, add chol med Vascepa to the Crestor      Relevant Orders   Ambulatory referral to Vascular Surgery    Other Visit Diagnoses   None.     Follow up plan: Return in about 4 weeks (around 07/21/2016) for smoking cessation and blood pressure.  An after-visit summary was printed and given to the patient at check-out.  Please see the patient instructions which may contain other information and recommendations  beyond what is mentioned above in the assessment and plan.  Meds ordered this encounter  Medications  . amLODipine (NORVASC) 2.5 MG tablet    Sig: Take 1 tablet (2.5 mg total) by mouth daily. For blood pressure    Dispense:  30 tablet    Refill:  5  . varenicline (CHANTIX STARTING MONTH PAK) 0.5 MG X 11 & 1 MG X 42 tablet    Sig: Take one 0.5 mg tablet by mouth once daily for 3 days, then increase to one 0.5 mg tablet twice daily for 4 days, then increase to one 1 mg tablet twice daily.    Dispense:  53 tablet    Refill:  0  . varenicline (CHANTIX CONTINUING MONTH PAK) 1 MG tablet    Sig: Take 1 tablet (1 mg total) by mouth 2 (two) times daily.    Dispense:  60 tablet    Refill:  3    Fill 2nd  . Icosapent Ethyl (VASCEPA) 1 g CAPS    Sig: Take 2 capsules by mouth 2 (two) times daily.    Dispense:  120 capsule    Refill:  5    *I am willing to help fill out patient assistance forms if needed*, I am at [facility]    Orders Placed  This Encounter  Procedures  . CBC with Differential/Platelet  . TSH  . Ferritin  . Ambulatory referral to Gynecology  . Ambulatory referral to Vascular Surgery

## 2016-06-23 NOTE — Assessment & Plan Note (Addendum)
Not controlled; advised smoking cessation; try DASH guidelines; avoid decongestants; add CCB; recheck BP at visit in 4 weeks

## 2016-06-23 NOTE — Assessment & Plan Note (Signed)
Dietary education, avoid starchy foods and fried foods; continue statin; add vascepa; check lipids in 3 months after starting

## 2016-06-23 NOTE — Assessment & Plan Note (Addendum)
Check CBC and TSH; refer to gyn for work-up

## 2016-06-23 NOTE — Assessment & Plan Note (Signed)
Order Vascepa; avoid saturated fats

## 2016-06-23 NOTE — Assessment & Plan Note (Addendum)
Smoker, high chol, fam hx; refer to vasc; stop smoking, add chol med Vascepa to the [employer]restor

## 2016-06-23 NOTE — Assessment & Plan Note (Signed)
Spent > 3 minutes talking with patient about smoking, risks of smoking, associated health problems, encouragement given to stop; urged breaking habits; Discussed different forms of smoking cessation with the patient, and I encouraged cessation. Patient requests trial of Chantix. patient opts to try quitting with Chantix. The 1-800-QUIT-NOW number given in patient instructions. Patient was encouraged to choose a quit date about 1 week after starting medicine.

## 2016-06-28 ENCOUNTER — Other Ambulatory Visit: Payer: Self-pay

## 2016-06-28 ENCOUNTER — Telehealth: Payer: Self-pay

## 2016-06-28 DIAGNOSIS — E7800 Pure hypercholesterolemia, unspecified: Secondary | ICD-10-CM

## 2016-06-28 NOTE — Telephone Encounter (Signed)
Called pt back to discuss rx that she has called about that she has not received chantex and a cholesterol med. [facility] said they would [facility] because those are two expensive drugs and they should be filled there. No answer phone has been cut off.

## 2016-06-29 LAB — LIPID PANEL
CHOLESTEROL TOTAL: 151 mg/dL (ref 100–199)
Chol/HDL Ratio: 5 ratio units — ABNORMAL HIGH (ref 0.0–4.4)
HDL: 30 mg/dL — AB (ref >39)
TRIGLYCERIDES: 587 mg/dL — AB (ref 0–149)

## 2016-07-01 ENCOUNTER — Telehealth: Payer: Self-pay

## 2016-07-01 NOTE — Telephone Encounter (Signed)
Called the number in the computer it is out of service so I called the number she called me from the other day [phone]. Boyfriend said he would have her call me back. Will discuss with her the doctors note and results. Also, about the RX she has not received and [facility] about her needing to be a pt of theirs before the could fill the RX.

## 2016-07-15 ENCOUNTER — Ambulatory Visit: Payer: Self-pay | Admitting: Pharmacy Technician

## 2016-07-15 NOTE — Progress Notes (Signed)
Patient scheduled for eligibility appointment at [facility].  Patient did not show for the appointment on 07/15/16 at 10:30a.m.Marland Kitchen.  Patient did not reschedule eligibility appointment.

## 2016-07-21 ENCOUNTER — Ambulatory Visit: Payer: Self-pay

## 2016-07-26 ENCOUNTER — Ambulatory Visit: Payer: Self-pay

## 2016-08-16 ENCOUNTER — Ambulatory Visit: Payer: Self-pay

## 2016-09-01 ENCOUNTER — Ambulatory Visit: Payer: Self-pay | Admitting: Pharmacy Technician

## 2016-09-01 NOTE — Progress Notes (Signed)
Patient scheduled for eligibility appointment at [facility].  Patient did not show for the appointment on 09/01/16 at 9:00a.m.  Patient did not reschedule eligibility appointment.  Sherilyn DacostaBetty J. Makailah Slavick Care Manager [facility]

## 2016-09-09 ENCOUNTER — Telehealth: Payer: Self-pay

## 2016-09-09 NOTE — Telephone Encounter (Signed)
Pt called in [facility] I have tried calling pt but I have got no answer or the numbers are disconnected to find out which medication and [facility] because [facility] looks like in her chart that [facility].

## 2016-09-15 ENCOUNTER — Ambulatory Visit: Payer: Self-pay

## 2016-09-16 ENCOUNTER — Other Ambulatory Visit: Payer: Self-pay | Admitting: Urology

## 2016-09-21 ENCOUNTER — Telehealth: Payer: Self-pay

## 2016-09-21 NOTE — Telephone Encounter (Signed)
Referral for vascular and gynecology was sent today.

## 2016-09-22 ENCOUNTER — Telehealth: Payer: Self-pay

## 2016-09-22 NOTE — Telephone Encounter (Signed)
[facility] application sent in today.

## 2016-10-05 ENCOUNTER — Ambulatory Visit
Admission: RE | Admit: 2016-10-05 | Discharge: 2016-10-05 | Disposition: A | Discharge status: Home / Self Care | Payer: Self-pay | Source: Ambulatory Visit | Attending: Oncology | Admitting: Oncology

## 2016-10-05 ENCOUNTER — Ambulatory Visit: Payer: Self-pay | Attending: Oncology | Admitting: *Deleted

## 2016-10-05 ENCOUNTER — Encounter (INDEPENDENT_AMBULATORY_CARE_PROVIDER_SITE_OTHER): Payer: Self-pay

## 2016-10-05 ENCOUNTER — Encounter: Payer: Self-pay | Admitting: *Deleted

## 2016-10-05 VITALS — BP 132/82 | HR 69 | Temp 97.4°F | Resp 18 | Ht 66.54 in | Wt 164.7 lb

## 2016-10-05 DIAGNOSIS — Z0000 Encounter for general adult medical examination without abnormal findings: Secondary | ICD-10-CM

## 2016-10-05 NOTE — Progress Notes (Signed)
Subjective:     Patient ID: Erica Moreno, female   DOB: 08-24-1966, 51 y.o.   MRN: [id number]  Patient presents for well women visit.     Review of Systems     Objective:   Physical Exam  Pulmonary/Chest: Right breast exhibits no inverted nipple, no mass, no nipple discharge, no skin change and no tenderness. Left breast exhibits no inverted nipple, no mass, no nipple discharge, no skin change and no tenderness. Breasts are symmetrical.  Abdominal: There is no splenomegaly or hepatomegaly.  Genitourinary: No labial fusion. There is no rash, tenderness, lesion or injury on the right labia. There is no rash, tenderness, lesion or injury on the left labia. Cervix exhibits discharge. Cervix exhibits no motion tenderness and no friability. Right adnexum displays no mass, no tenderness and no fullness. Left adnexum displays no mass, no tenderness and no fullness. No erythema, tenderness or bleeding in the vagina. No foreign body in the vagina. No signs of injury around the vagina. Vaginal discharge found.  Genitourinary Comments: White non-odorous discharge       Assessment:     51 year old Black [facility] clinical breast exam, pap and mammogram.  Patient is a very poor historian.  States [place].  Also states she had a history of abnormal paps, but last pap was about 10 years ago also.   Clinical breast exam unremarkable.  Taught self breast awareness.  Specimen collected for pap smear without difficulty.  There was some obscuring white non-odorous vaginal discharge noted.  Patient has been screened for eligibility.  She does not have any insurance, Medicare or Medicaid.  She also meets financial eligibility.  Hand-out given on the Affordable Care Act.    Plan:     Screening mammogram ordered since there are no results available for review, and her breast exam was normal.  Specimen for pap sent to  the lab.  Will follow-up per [facility] protocol.

## 2016-10-05 NOTE — Patient Instructions (Signed)
HPV Test The human papillomavirus (HPV) test is used to look for high-risk types of HPV infection. HPV is a group of about 100 viruses. Many of these viruses cause growths on, in, or around the genitals. Most HPV viruses cause infections that usually go away without treatment. However, HPV types 6, 11, 16, and 18 are considered high-risk types of HPV that can increase your risk of cancer of the cervix or anus if the infection is left untreated. An HPV test identifies the DNA (genetic) strands of the HPV infection, so it is also referred to as the HPV DNA test. Although HPV is found in both males and females, the HPV test is only used to screen for increased cancer risk in females:  With an abnormal Pap test.  After treatment of an abnormal Pap test.  Between the ages of 30 and 65.  After treatment of a high-risk HPV infection. The HPV test may be done at the same time as a pelvic exam and Pap test in females over the age of 30. Both the HPV test and Pap test require a sample of cells from the cervix. How do I prepare for this test?  Do not douche or take a bath for 24-48 hours before the test or as directed by your health care provider.  Do not have sex for 24-48 hours before the test or as directed by your health care provider.  You may be asked to reschedule the test if you are menstruating.  You will be asked to urinate before the test. What do the results mean? It is your responsibility to obtain your test results. Ask the lab or department performing the test when and how you will get your results. Talk with your health care provider if you have any questions about your results. Your result will be negative or positive. Meaning of Negative Test Results  A negative HPV test result means that no HPV was found, and it is very likely that you do not have HPV. Meaning of Positive Test Results  A positive HPV test result indicates that you have HPV.  If your test result shows the presence  of any high-risk HPV strains, you may have an increased risk of developing cancer of the cervix or anus if the infection is left untreated.  If any low-risk HPV strains are found, you are not likely to have an increased risk of cancer. Discuss your test results with your health care provider. He or she will use the results to make a diagnosis and determine a treatment plan that is right for you. Talk with your health care provider to discuss your results, treatment options, and if necessary, the need for more tests. Talk with your health care provider if you have any questions about your results. This information is not intended to replace advice given to you by your health care provider. Make sure you discuss any questions you have with your health care provider. Document Released: 09/09/2004 Document Revised: 04/20/2016 Document Reviewed: 12/31/2013 Elsevier Interactive Patient Education  2017 Elsevier Inc.  Gave patient hand-out, Women Staying Healthy, Active and Well from [facility], with education on breast health, pap smears, heart and colon health.  

## 2016-10-07 ENCOUNTER — Encounter (INDEPENDENT_AMBULATORY_CARE_PROVIDER_SITE_OTHER): Payer: Self-pay | Admitting: Vascular Surgery

## 2016-10-07 LAB — PAP LB AND HPV HIGH-RISK
HPV, high-risk: NEGATIVE
PAP SMEAR COMMENT: 0

## 2016-10-11 ENCOUNTER — Ambulatory Visit: Payer: Self-pay

## 2016-10-11 ENCOUNTER — Ambulatory Visit: Payer: Self-pay | Admitting: Pharmacy Technician

## 2016-10-11 DIAGNOSIS — Z79899 Other long term (current) drug therapy: Secondary | ICD-10-CM

## 2016-10-11 NOTE — Progress Notes (Signed)
Completed [facility] application and contract.  Patient agreed to all terms of the [facility] contract.  Patient provided 2018 proof of income.  As long as patient continues to meet eligibility criteria and remain uninsured, medication assistance will be provided by [facility].  Provided patient with community resource material based on her particular needs.    Chantix Prescription Application completed with patient.  [facility].  Upon receipt of signed application from provider Chantix Prescription Application will be submitted to [employer].  Sherilyn DacostaBetty J. Moreno Care Manager [facility]

## 2016-10-12 ENCOUNTER — Ambulatory Visit: Payer: Self-pay | Admitting: Internal Medicine

## 2016-10-12 ENCOUNTER — Encounter: Payer: Self-pay | Admitting: Medical Oncology

## 2016-10-12 ENCOUNTER — Emergency Department
Admission: EM | Admit: 2016-10-12 | Discharge: 2016-10-12 | Disposition: A | Discharge status: Home / Self Care | Payer: Self-pay | Attending: Emergency Medicine | Admitting: Emergency Medicine

## 2016-10-12 ENCOUNTER — Encounter: Payer: Self-pay | Admitting: Internal Medicine

## 2016-10-12 VITALS — BP 129/77 | HR 78 | Temp 98.0°F | Wt 163.0 lb

## 2016-10-12 DIAGNOSIS — F17210 Nicotine dependence, cigarettes, uncomplicated: Secondary | ICD-10-CM | POA: Insufficient documentation

## 2016-10-12 DIAGNOSIS — M5020 Other cervical disc displacement, unspecified cervical region: Secondary | ICD-10-CM

## 2016-10-12 DIAGNOSIS — Z79899 Other long term (current) drug therapy: Secondary | ICD-10-CM | POA: Insufficient documentation

## 2016-10-12 DIAGNOSIS — I10 Essential (primary) hypertension: Secondary | ICD-10-CM | POA: Insufficient documentation

## 2016-10-12 DIAGNOSIS — M436 Torticollis: Secondary | ICD-10-CM | POA: Insufficient documentation

## 2016-10-12 MED ORDER — CYCLOBENZAPRINE HCL 10 MG PO TABS: 10.0000 mg | ORAL_TABLET | Freq: Every day | ORAL | 1 refills | Status: AC

## 2016-10-12 MED ORDER — KETOROLAC TROMETHAMINE 30 MG/ML IJ SOLN
30.0000 mg | Freq: Once | INTRAMUSCULAR | Status: AC
  Administered 2016-10-12: 30 mg via INTRAMUSCULAR
  Filled 2016-10-12: qty 1

## 2016-10-12 MED ORDER — ETODOLAC 400 MG PO TABS: 400.0000 mg | ORAL_TABLET | Freq: Two times a day (BID) | ORAL | 0 refills | Status: AC

## 2016-10-12 MED ORDER — AMLODIPINE BESYLATE 2.5 MG PO TABS: 2.5000 mg | ORAL_TABLET | Freq: Every day | ORAL | 3 refills | Status: AC

## 2016-10-12 MED ORDER — NAPROXEN 250 MG PO TABS: 250.0000 mg | ORAL_TABLET | Freq: Two times a day (BID) | ORAL | 0 refills | Status: AC

## 2016-10-12 MED ORDER — ATORVASTATIN CALCIUM 20 MG PO TABS: 20.0000 mg | ORAL_TABLET | Freq: Every day | ORAL | 3 refills | Status: AC

## 2016-10-12 MED ORDER — HYDROCHLOROTHIAZIDE 25 MG PO TABS: 25.0000 mg | ORAL_TABLET | Freq: Every day | ORAL | 3 refills | Status: AC

## 2016-10-12 MED ORDER — DIAZEPAM 2 MG PO TABS: 2.0000 mg | ORAL_TABLET | Freq: Three times a day (TID) | ORAL | 0 refills | Status: AC | PRN

## 2016-10-12 MED ORDER — ATENOLOL 50 MG PO TABS: 50.0000 mg | ORAL_TABLET | Freq: Two times a day (BID) | ORAL | 3 refills | Status: AC

## 2016-10-12 MED ORDER — DIAZEPAM 5 MG PO TABS
5.0000 mg | ORAL_TABLET | Freq: Once | ORAL | Status: AC
  Administered 2016-10-12: 5 mg via ORAL
  Filled 2016-10-12: qty 1

## 2016-10-12 NOTE — Progress Notes (Signed)
   Subjective:    Patient ID: Erica Moreno, female    DOB: [birth year], 51 y.o.   MRN: [id number]  HPI  Pt reports shoulder and neck pain on left side. Reports it comes and goes but is extreme pain when it's present.   Patient Active Problem List   Diagnosis Date Noted  . Decreased pulses in feet 06/23/2016  . Heavy periods 06/23/2016  . Hypertriglyceridemia 06/23/2016  . Tobacco abuse 06/23/2016  . Colon cancer screening 12/24/2015  . Medication monitoring encounter 12/24/2015  . Essential hypertension 04/18/2015  . 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 Elevated lipids 04/18/2015   Allergies as of 10/12/2016      Reactions   Aspirin Hives      Medication List       Accurate as of 10/12/16 10:13 AM. Always use your most recent med list.          amLODipine 2.5 MG tablet Commonly known as:  NORVASC Take 1 tablet (2.5 mg total) by mouth daily. For blood pressure   atenolol 50 MG tablet Commonly known as:  TENORMIN Take 1 tablet (50 mg total) by mouth 2 (two) times daily.   cyclobenzaprine 10 MG tablet Commonly known as:  FLEXERIL Take 1 tablet (10 mg total) by mouth at bedtime. prn   hydrochlorothiazide 25 MG tablet Commonly known as:  HYDRODIURIL Take 1 tablet (25 mg total) by mouth daily.   naproxen 250 MG tablet Commonly known as:  NAPROSYN Take 1 tablet (250 mg total) by mouth 2 (two) times daily with a meal. PRN   rosuvastatin 10 MG tablet Commonly known as:  CRESTOR Take 1 tablet (10 mg total) by mouth at bedtime.   varenicline 0.5 MG X 11 & 1 MG X 42 tablet Commonly known as:  CHANTIX STARTING MONTH PAK Take one 0.5 mg tablet by mouth once daily for 3 days, then increase to one 0.5 mg tablet twice daily for 4 days, then increase to one 1 mg tablet twice daily.   varenicline 1 MG tablet Commonly known as:  CHANTIX CONTINUING MONTH PAK Take 1 tablet (1 mg total) by mouth 2 (two) times daily.         Review of  Systems     Objective:   Physical Exam  Constitutional: She is oriented to person, place, and time.  Cardiovascular: Normal rate, regular rhythm and normal heart sounds.   Pulmonary/Chest: Effort normal and breath sounds normal.  Neurological: She is alert and oriented to person, place, and time.    BP 129/77   Pulse 78   Temp 98 F (36.7 C) (Oral)   Wt 163 lb (73.9 kg)   LMP 09/17/2016   BMI 25.89 kg/m        Assessment & Plan:   Labs today: Met C, CBC, Lipid, TSH, Ua, A1C F/u in 6 months w/ labs: Met C, CBC, Lipid Referral to [facility] at [facility] for PT for cervical disc syndrome

## 2016-10-12 NOTE — Patient Instructions (Signed)
Labs today F/u in 6 months w/ labs [facility]

## 2016-10-12 NOTE — ED Notes (Signed)
See triage note  States she is having pain from left side of neck moving into left arm. Denies any injury  Has had similar episodes in past

## 2016-10-12 NOTE — ED Triage Notes (Signed)
Pt reports left sided neck pain that has been off and on for a few years, pt denies injury. Reports painful to move.

## 2016-10-12 NOTE — ED Provider Notes (Signed)
[facility] Emergency Department Provider Note  ____________________________________________  Time seen: Approximately 12:21 PM  I have reviewed the triage vital signs and the nursing notes.   HISTORY  Chief Complaint Neck Pain    HPI Erica Moreno is a 51 y.o. female, NAD, presents to the emergency department with several-day history of painful and stiff neck. Patient states she has had cervical stenosis and neck pain for many years. States she gets pain and stiffness about the neck off and on. States that this episode began a couple of days ago and has just steadily worsened. Has been on anti-inflammatories and muscle relaxers in the past which some help and some didn't. She has not taken anything other than BC powder and Tylenol for the pain at this point. Denies any recent injuries, traumas or falls. Has not noted any redness, swelling, skin sores, burning, numbness or tingling to the area. Denies any numbness, weakness or tingling in her extremities. Denies headaches or visual changes. No back pain. No recent illness, fevers, chills or body aches.   Past Medical History:  Diagnosis Date  . Hyperlipidemia   . Hypertension     Patient Active Problem List   Diagnosis Date Noted  . Decreased pulses in feet 06/23/2016  . Heavy periods 06/23/2016  . Hypertriglyceridemia 06/23/2016  . Tobacco abuse 06/23/2016  . Colon cancer screening 12/24/2015  . Medication monitoring encounter 12/24/2015  . Essential hypertension 04/18/2015  . 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 Elevated lipids 04/18/2015    Past Surgical History:  Procedure Laterality Date  . APPENDECTOMY  06/2013  . BREAST BIOPSY Left    2008? bx neg  . CESAREAN SECTION  1988    Prior to Admission medications   Medication Sig Start Date End Date Taking? Authorizing Provider  amLODipine (NORVASC) 2.5 MG tablet Take 1 tablet (2.5 mg total) by mouth daily.  For blood pressure 10/12/16   Virl Axeon C Chaplin, MD  atenolol (TENORMIN) 50 MG tablet Take 1 tablet (50 mg total) by mouth 2 (two) times daily. 10/12/16   Virl Axeon C Chaplin, MD  atorvastatin (LIPITOR) 20 MG tablet Take 1 tablet (20 mg total) by mouth daily. 10/12/16   Virl Axeon C Chaplin, MD  cyclobenzaprine (FLEXERIL) 10 MG tablet Take 1 tablet (10 mg total) by mouth at bedtime. prn 10/12/16   Virl Axeon C Chaplin, MD  diazepam (VALIUM) 2 MG tablet Take 1 tablet (2 mg total) by mouth every 8 (eight) hours as needed for muscle spasms. 10/12/16 10/19/16  Annastacia Duba L Cooper Stamp, PA-C  etodolac (LODINE) 400 MG tablet Take 1 tablet (400 mg total) by mouth 2 (two) times daily. 10/12/16 10/19/16  Josimar Corning L Shekira Drummer, PA-C  hydrochlorothiazide (HYDRODIURIL) 25 MG tablet Take 1 tablet (25 mg total) by mouth daily. 10/12/16   Virl Axeon C Chaplin, MD  naproxen (NAPROSYN) 250 MG tablet Take 1 tablet (250 mg total) by mouth 2 (two) times daily with a meal. PRN 10/12/16   Virl Axeon C Chaplin, MD  varenicline (CHANTIX CONTINUING MONTH PAK) 1 MG tablet Take 1 tablet (1 mg total) by mouth 2 (two) times daily. Patient not taking: Reported on 10/12/2016 06/23/16   Kerman PasseyMelinda P Lada, MD  varenicline (CHANTIX STARTING MONTH PAK) 0.5 MG X 11 & 1 MG X 42 tablet Take one 0.5 mg tablet by mouth once daily for 3 days, then increase to one 0.5 mg tablet twice daily for 4 days, then increase to one 1 mg tablet twice daily. Patient  not taking: Reported on 10/12/2016 06/23/16   Kerman Passey, MD    Allergies Aspirin  Family History  Problem Relation Age of Onset  . Heart failure Mother   . Aneurysm Father   . Renal Disease Father   . Kidney disease Father     Social History Social History  Substance Use Topics  . Smoking status: Current Every Day Smoker    Packs/day: 1.00    Years: 20.00    Types: Cigarettes    Last attempt to quit: 01/16/2015  . Smokeless tobacco: Never Used  . Alcohol use No     Comment: reports EtOH use on special occasions & holidays     Review of  Systems  Constitutional: No fever/chills Eyes: No visual changes. Cardiovascular: No chest pain. Respiratory: No shortness of breath.  Musculoskeletal: Positive neck pain and stiffness. Negative for back pain.  Skin: Negative for rash, redness, swelling, skin sores. Neurological: Negative for headaches, focal weakness or numbness. No tingling. 10-point ROS otherwise negative.  ____________________________________________   PHYSICAL EXAM:  VITAL SIGNS: ED Triage Vitals  Enc Vitals Group     BP 10/12/16 1114 139/76     Pulse Rate 10/12/16 1114 72     Resp 10/12/16 1114 18     Temp 10/12/16 1114 98.1 F (36.7 C)     Temp Source 10/12/16 1114 Oral     SpO2 10/12/16 1114 100 %     Weight 10/12/16 1114 163 lb (73.9 kg)     Height 10/12/16 1114 5\' 6"  (1.676 m)     Head Circumference --      Peak Flow --      Pain Score 10/12/16 1115 9     Pain Loc --      Pain Edu? --      Excl. in GC? --      Constitutional: Alert and oriented. Well appearing and in no acute distress. Eyes: Conjunctivae are normal. Head: Atraumatic. Neck: No stridor. No cervical spine tenderness to palpation. Tenderness to palpation about the left trapezial muscle with mild spasm. No spasm or tenderness about the right trapezial muscle. Sternocleidomastoid on the left is mildly spasmed and more prominent with tenderness to palpation. Right sternocleidomastoid is soft without spasm or tenderness. Patient has decreased range of motion of the cervical spine due to pain and spasm. Full range of motion of the cervical spine was achieved after patient was given Toradol and diazepam. Hematological/Lymphatic/Immunilogical: No cervical lymphadenopathy. Cardiovascular: Normal rate, regular rhythm. Normal S1 and S2.  Good peripheral circulation. Respiratory: Normal respiratory effort without tachypnea or retractions. Lungs CTAB with breath sounds noted in all lung fields. No wheeze, rhonchi, rales. Neurologic:  Normal  speech and language. No gross focal neurologic deficits are appreciated.  Skin:  Skin is warm, dry and intact. No rash, redness, swelling, abnormal warmth, skin sores noted. Psychiatric: Mood and affect are normal. Speech and behavior are normal. Patient exhibits appropriate insight and judgement.   ____________________________________________   LABS  None ____________________________________________  EKG  None ____________________________________________  RADIOLOGY  None ____________________________________________    PROCEDURES  Procedure(s) performed: None   Procedures   Medications  ketorolac (TORADOL) 30 MG/ML injection 30 mg (30 mg Intramuscular Given 10/12/16 1251)  diazepam (VALIUM) tablet 5 mg (5 mg Oral Given 10/12/16 1250)    ____________________________________________   INITIAL IMPRESSION / ASSESSMENT AND PLAN / ED COURSE  Pertinent labs & imaging results that were available during my care of the patient were reviewed by  me and considered in my medical decision making (see chart for details).  Clinical Course as of Oct 13 1403  Wed Oct 12, 2016  1329 Patient has significantly improved range of motion of the cervical spine since being given diazepam and Toradol. She states her pain has significantly decreased and is much improved  [JH]    Clinical Course User Index [JH] Arvid Marengo L Anastacio Bua, PA-C    Patient's diagnosis is consistent with Acute torticollis. Patient will be discharged home with prescriptions for Lodine and Valium to take as directed. Patient is to follow up with her primary care provider or [facility] if symptoms persist past this treatment course. Patient is given ED precautions to return to the ED for any worsening or new symptoms.   ____________________________________________  FINAL CLINICAL IMPRESSION(S) / ED DIAGNOSES  Final diagnoses:  Torticollis, acute      NEW MEDICATIONS STARTED DURING THIS VISIT:  Discharge  Medication List as of 10/12/2016  1:34 PM    START taking these medications   Details  diazepam (VALIUM) 2 MG tablet Take 1 tablet (2 mg total) by mouth every 8 (eight) hours as needed for muscle spasms., Starting Wed 10/12/2016, Until Wed 10/19/2016, Print    etodolac (LODINE) 400 MG tablet Take 1 tablet (400 mg total) by mouth 2 (two) times daily., Starting Wed 10/12/2016, Until Wed 10/19/2016, Print             Erica Kiel Geneva, PA-C 10/12/16 1406    Arnaldo Natal, MD 10/12/16 [phone]

## 2016-10-13 LAB — CBC
HEMATOCRIT: 41.3 % (ref 34.0–46.6)
Hemoglobin: 13.7 g/dL (ref 11.1–15.9)
MCH: 27.5 pg (ref 26.6–33.0)
MCHC: 33.2 g/dL (ref 31.5–35.7)
MCV: 83 fL (ref 79–97)
Platelets: 328 10*3/uL (ref 150–379)
RBC: 4.98 x10E6/uL (ref 3.77–5.28)
RDW: 14.7 % (ref 12.3–15.4)
WBC: 6.4 10*3/uL (ref 3.4–10.8)

## 2016-10-13 LAB — COMPREHENSIVE METABOLIC PANEL
A/G RATIO: 1.5 (ref 1.2–2.2)
ALK PHOS: 91 IU/L (ref 39–117)
ALT: 20 IU/L (ref 0–32)
AST: 20 IU/L (ref 0–40)
Albumin: 4.8 g/dL (ref 3.5–5.5)
BUN/Creatinine Ratio: 11 (ref 9–23)
BUN: 8 mg/dL (ref 6–24)
Bilirubin Total: <0.2 mg/dL (ref 0.0–1.2)
CALCIUM: 9.9 mg/dL (ref 8.7–10.2)
CO2: 26 mmol/L (ref 18–29)
Chloride: 95 mmol/L — ABNORMAL LOW (ref 96–106)
Creatinine, Ser: 0.76 mg/dL (ref 0.57–1.00)
GFR calc Af Amer: 107 mL/min/{1.73_m2} (ref >59)
GFR, EST NON AFRICAN AMERICAN: 93 mL/min/{1.73_m2} (ref >59)
Globulin, Total: 3.2 g/dL (ref 1.5–4.5)
Glucose: 84 mg/dL (ref 65–99)
Potassium: 3.4 mmol/L — ABNORMAL LOW (ref 3.5–5.2)
Sodium: 139 mmol/L (ref 134–144)
Total Protein: 8 g/dL (ref 6.0–8.5)

## 2016-10-13 LAB — URINALYSIS
Bilirubin, UA: NEGATIVE
Glucose, UA: NEGATIVE
KETONES UA: NEGATIVE
LEUKOCYTES UA: NEGATIVE
NITRITE UA: NEGATIVE
PROTEIN UA: NEGATIVE
RBC UA: NEGATIVE
Specific Gravity, UA: 1.018 (ref 1.005–1.030)
Urobilinogen, Ur: 0.2 mg/dL (ref 0.2–1.0)
pH, UA: 6 (ref 5.0–7.5)

## 2016-10-13 LAB — LIPID PANEL
Chol/HDL Ratio: 5.2 ratio units — ABNORMAL HIGH (ref 0.0–4.4)
Cholesterol, Total: 170 mg/dL (ref 100–199)
HDL: 33 mg/dL — ABNORMAL LOW (ref >39)
Triglycerides: 659 mg/dL (ref 0–149)

## 2016-10-13 LAB — HEMOGLOBIN A1C
ESTIMATED AVERAGE GLUCOSE: 117 mg/dL
Hgb A1c MFr Bld: 5.7 % — ABNORMAL HIGH (ref 4.8–5.6)

## 2016-10-13 LAB — TSH: TSH: 0.459 u[IU]/mL (ref 0.450–4.500)

## 2016-10-18 ENCOUNTER — Telehealth: Payer: Self-pay

## 2016-10-18 NOTE — Telephone Encounter (Signed)
[facility] for  placed for provider to sign.

## 2016-10-19 ENCOUNTER — Telehealth: Payer: Self-pay

## 2016-10-19 NOTE — Telephone Encounter (Signed)
Called pt with results. No answer. Left msg.

## 2016-10-21 ENCOUNTER — Inpatient Hospital Stay
Admission: RE | Admit: 2016-10-21 | Discharge: 2016-10-21 | Disposition: A | Discharge status: Home / Self Care | Payer: Self-pay | Source: Ambulatory Visit | Attending: *Deleted | Admitting: *Deleted

## 2016-10-21 ENCOUNTER — Other Ambulatory Visit: Payer: Self-pay | Admitting: *Deleted

## 2016-10-21 DIAGNOSIS — Z9289 Personal history of other medical treatment: Secondary | ICD-10-CM

## 2016-10-21 NOTE — Telephone Encounter (Signed)
Placed signed application/script in [facility] folder for pickup. 

## 2016-10-27 ENCOUNTER — Encounter: Payer: Self-pay | Admitting: *Deleted

## 2016-10-27 NOTE — Progress Notes (Signed)
Letter mailed to inform patient of her normal mammogram and pap smear.  She is to follow-up in one year with annual mammogram.  Next pap will be due in 5 years.  [facility] to [place].

## 2016-11-02 ENCOUNTER — Ambulatory Visit: Payer: Self-pay | Admitting: Internal Medicine

## 2016-12-02 ENCOUNTER — Telehealth: Payer: Self-pay | Admitting: Pharmacist

## 2016-12-02 NOTE — Telephone Encounter (Signed)
12/02/16 Faxing [employer] application for Chantix  Take 1 tablet by mouth two times a day.

## 2017-04-12 ENCOUNTER — Other Ambulatory Visit: Payer: Self-pay

## 2017-04-19 ENCOUNTER — Ambulatory Visit: Payer: Self-pay | Admitting: Internal Medicine

## 2017-11-03 ENCOUNTER — Telehealth: Payer: Self-pay | Admitting: Pharmacy Technician

## 2017-11-03 NOTE — Telephone Encounter (Signed)
Patient failed to provide 2019 financial documentation.  No additional medication assistance will be provided by [facility] without the required proof of income documentation.  Patient notified by letter.  Amarri Satterly J. Ameer Sanden Care Manager [facility] 

## 2018-06-13 ENCOUNTER — Telehealth: Payer: Self-pay

## 2018-06-13 NOTE — Telephone Encounter (Signed)
-----   Message from Rolm Gala, NP sent at 06/05/2018  2:41 PM EDT ----- Please call and schedule an appointment for Ms. Veach.

## 2018-06-13 NOTE — Telephone Encounter (Signed)
Called pt to see if pt wanted to make an appt. Pt has not been seen in awhile. PT will need to update info as well. Pt's number is out of service.

## 2019-12-24 ENCOUNTER — Encounter: Attending: Obstetrics & Gynecology | Primary: Internal Medicine

## 2020-01-08 ENCOUNTER — Ambulatory Visit
Admit: 2020-01-08 | Discharge: 2020-01-08 | Payer: PRIVATE HEALTH INSURANCE | Attending: Obstetrics & Gynecology | Primary: Internal Medicine

## 2020-01-08 DIAGNOSIS — Z01419 Encounter for gynecological examination (general) (routine) without abnormal findings: Secondary | ICD-10-CM

## 2020-01-08 NOTE — Progress Notes (Signed)
Sheila Payne  01/08/2020              54 y.o.           Primary Care Physician: Irven Easterly, MD  Chief Complaint   Patient presents with   ??? New Patient     annual exam         HPI : Sheila Payne is a 54 y.o. female here for annual exam She is doing well and has no complaints at this time.   ________________________________________________________________________  Gynecologic History:    Menopause at 54 yo.   Hysterectomy was done in 1998 for tretment of Endometriosis. Ovaries were removed. No history of uterine or cervical cancer. No hx of abnormal pap smear.      Patient is not on hormone replacement therapy       Sexual dysfunction  na    Preventative Health Testing:  Date of Last Pap Smear: 2015, neg  Date of Last Mammogram: 202, neg  Date of Last Colonoscopy: 2019, neg  Date of Last Bone Density: na    OB History   Gravida Para Term Preterm AB Living   2         2   SAB TAB Ectopic Molar Multiple Live Births                    # Outcome Date GA Lbr Len/2nd Weight Sex Delivery Anes PTL Lv   2 [place]            1 [place]              Past Medical History:   Diagnosis Date   ??? Type 2 diabetes mellitus (HCC)                                                                    Past Surgical History:   Procedure Laterality Date   ??? HYSTERECTOMY  1998    complete for treatment of Endometriosis     Family History   Problem Relation Age of Onset   ??? Diabetes Father    ??? Diabetes Sister      Social History     Socioeconomic History   ??? Marital status: Unknown     Spouse name: Not on file   ??? Number of children: Not on file   ??? Years of education: Not on file   ??? Highest education level: Not on file   Occupational History   ??? Not on file   Social Needs   ??? Financial resource strain: Not on file   ??? Food insecurity     Worry: Not on file     Inability: Not on file   ??? Transportation needs     Medical: Not on file     Non-medical: Not on file   Tobacco Use   ??? Smoking status: Never Smoker   ??? Smokeless tobacco: Never Used    Substance and Sexual Activity   ??? Alcohol use: Never     Frequency: Never     Binge frequency: Never   ??? Drug use: Never   ??? Sexual activity: Yes     Partners: Male     Comment: husband   Lifestyle   ???  Physical activity     Days per week: Not on file     Minutes per session: Not on file   ??? Stress: Not on file   Relationships   ??? Social Wellsite geologist on phone: Not on file     Gets together: Not on file     Attends religious service: Not on file     Active member of club or organization: Not on file     Attends meetings of clubs or organizations: Not on file     Relationship status: Not on file   ??? Intimate partner violence     Fear of current or ex partner: Not on file     Emotionally abused: Not on file     Physically abused: Not on file     Forced sexual activity: Not on file   Other Topics Concern   ??? Not on file   Social History Narrative   ??? Not on file       MEDICATIONS:  Current Outpatient Medications   Medication Sig Dispense Refill   ??? metFORMIN (GLUCOPHAGE-XR) 500 MG extended release tablet      ??? Ascorbic Acid (VITAMIN C) 100 MG tablet Take 100 mg by mouth daily     ??? VITAMIN D PO Take 400 mg by mouth       No current facility-administered medications for this visit.        ALLERGIES:  Allergies as of 01/08/2020 - Review Complete 01/08/2020   Allergen Reaction Noted   ??? Demerol hcl [meperidine]  01/08/2020       REVIEW OF SYSTEMS:      CONSTIUTIONAL:  No weight change or fatigue  CV: No Chest Pain with Exertion, Palpitations, Syncope, Edema, Arrhythmia  RESPIRATORY: No SOB or Cough,   BREAST: No breast abnormalities or lumps  GI: No Indigestion, Heartburn, Nausea, vomiting, Diarrhea, Constipation,Bloating or Bowel Changes; No Bloody Stools or melena  GU: No Dysuria, Hematuria or Nocturia. No Urinary Incontinence or Vaginal Discharge,vaginal bleeding, or dysparuenia.   NEURO: No CVA, Migraines, Seizure Hx, or Limb Weakness  DERM: No Rash, Itching, Mole Changes or Cancer  PSYCH: No Depression,  Homicidal thoughts,suicidal thoughts, or anxiety  MUSCULOSKELETAL: No Arthralgia, Arthritis  HEME and LYMPH :No Lymphoma, Von Willebrand's, Hemophillia or Bleeding History                                                   PHYSICAL EXAM:       Physical Exam      Vitals:    01/08/20 1548   BP: (!) 146/82   Pulse: 90   Temp: 97.8 ??F (36.6 ??C)   Weight: 150 lb (68 kg)   Height: 5\' 6"  (1.676 m)     Body mass index is 24.21 kg/m??.    GYN:  BREASTS: normal, no nipple retraction or discharge, no masses, tenderness or skin changes.  No lymphadenopathy.   EXTERNAL GENITALIA: normal female structures  VAGINA: normal ruggae, no lesions  CERVIX: surgically absent    UTERUS: surgically absent    ADNEXA: not palpable  URETHRA: normal. nontender  BLADDER: non tender.   PELVIC SUPPORT DEFECTS: Normal support of vagina and bladder  ANUS/PERINEUM: no hemorrhoids, masses or warts noted.     GENERAL EXAM    CONSTITUTIONAL:  Well developed, well  nourished, well groomed. no acute distress  NECK: no thyromegaly, supple.   CARDIOVASCULAR: normal rate and rhythm, no edema   LUNGS: Normal effort, normal lung sounds, CTA bilaterally with no wheezing, rales,  or rhhonchi.  ABDOMEN:soft, normal-active bowel sounds,  non-tender, non-distended, no hepatospleenomegaly, no rebound or guarding.   NEUROLOGICAL: no gross motor or sensory deficits noted. Marland Kitchen   LYMPH NODES: no lymphadenapathy axillary or inguinal.   MUSCULOSKELETAL: normal gait, full range of motion, no edema, Homan's absent  PSYCHIATRIC Normal mood and affect, A&O x3.       ASSESMENT/PLAN:  Sheila Payne was seen today for new patient.    Diagnoses and all orders for this visit:    Women's annual routine gynecological examination  -     DEXA BONE DENSITY AXIAL SKELETON; Future  -     MAM DIGITAL SCREEN W OR WO CAD BILATERAL; Future    Encounter for screening mammogram for breast cancer  -     MAM DIGITAL SCREEN W OR WO CAD BILATERAL; Future    Screening for osteoporosis  -     DEXA BONE  DENSITY AXIAL SKELETON; Future      Return in about 1 year (around 01/07/2021) for AE.    Routine health maintenance per patients PCP.
